# Patient Record
Sex: Male | Born: 1950
Health system: Southern US, Community
[De-identification: ages and names within clinical notes are randomized; demographics above are authoritative.]

## PROBLEM LIST (undated history)

## (undated) DIAGNOSIS — T4145XA Adverse effect of unspecified anesthetic, initial encounter: Secondary | ICD-10-CM

## (undated) DIAGNOSIS — M7750 Other enthesopathy of unspecified foot: Secondary | ICD-10-CM

## (undated) DIAGNOSIS — T8859XA Other complications of anesthesia, initial encounter: Secondary | ICD-10-CM

## (undated) DIAGNOSIS — I10 Essential (primary) hypertension: Secondary | ICD-10-CM

## (undated) DIAGNOSIS — K649 Unspecified hemorrhoids: Secondary | ICD-10-CM

## (undated) DIAGNOSIS — M7660 Achilles tendinitis, unspecified leg: Secondary | ICD-10-CM

## (undated) DIAGNOSIS — S93499A Sprain of other ligament of unspecified ankle, initial encounter: Secondary | ICD-10-CM

## (undated) DIAGNOSIS — S96819A Strain of other specified muscles and tendons at ankle and foot level, unspecified foot, initial encounter: Secondary | ICD-10-CM

## (undated) DIAGNOSIS — M25579 Pain in unspecified ankle and joints of unspecified foot: Secondary | ICD-10-CM

## (undated) DIAGNOSIS — M79609 Pain in unspecified limb: Secondary | ICD-10-CM

## (undated) DIAGNOSIS — I4891 Unspecified atrial fibrillation: Secondary | ICD-10-CM

## (undated) HISTORY — DX: Achilles tendinitis, unspecified leg: M76.60

## (undated) HISTORY — DX: Unspecified atrial fibrillation: I48.91

## (undated) HISTORY — DX: Essential (primary) hypertension: I10

## (undated) HISTORY — DX: Other enthesopathy of unspecified foot and ankle: M77.50

## (undated) HISTORY — DX: Unspecified hemorrhoids: K64.9

## (undated) HISTORY — DX: Pain in unspecified ankle and joints of unspecified foot: M25.579

## (undated) HISTORY — DX: Sprain of other ligament of unspecified ankle, initial encounter: S93.499A

## (undated) HISTORY — DX: Strain of other specified muscles and tendons at ankle and foot level, unspecified foot, initial encounter: S96.819A

## (undated) HISTORY — PX: TONSILLECTOMY: SUR1361

## (undated) HISTORY — DX: Pain in unspecified limb: M79.609

---

## 2002-06-16 ENCOUNTER — Ambulatory Visit (HOSPITAL_COMMUNITY): Admission: RE | Admit: 2002-06-16 | Discharge: 2002-06-16 | Payer: Self-pay | Admitting: Gastroenterology

## 2007-05-18 ENCOUNTER — Encounter: Payer: Self-pay | Admitting: Cardiovascular Disease

## 2007-05-18 ENCOUNTER — Ambulatory Visit: Payer: Self-pay

## 2007-05-18 ENCOUNTER — Ambulatory Visit: Payer: Self-pay | Admitting: Cardiovascular Disease

## 2007-06-15 ENCOUNTER — Ambulatory Visit: Payer: Self-pay | Admitting: Cardiovascular Disease

## 2007-11-28 ENCOUNTER — Ambulatory Visit: Payer: Self-pay | Admitting: Cardiovascular Disease

## 2007-12-02 ENCOUNTER — Ambulatory Visit: Payer: Self-pay | Admitting: Sports Medicine

## 2007-12-02 DIAGNOSIS — S93499A Sprain of other ligament of unspecified ankle, initial encounter: Secondary | ICD-10-CM

## 2007-12-02 DIAGNOSIS — M7660 Achilles tendinitis, unspecified leg: Secondary | ICD-10-CM | POA: Insufficient documentation

## 2007-12-02 DIAGNOSIS — M79609 Pain in unspecified limb: Secondary | ICD-10-CM

## 2007-12-02 DIAGNOSIS — S96819A Strain of other specified muscles and tendons at ankle and foot level, unspecified foot, initial encounter: Secondary | ICD-10-CM

## 2008-01-03 ENCOUNTER — Ambulatory Visit: Payer: Self-pay | Admitting: Sports Medicine

## 2008-03-21 ENCOUNTER — Ambulatory Visit: Payer: Self-pay | Admitting: Sports Medicine

## 2008-05-09 DIAGNOSIS — I4891 Unspecified atrial fibrillation: Secondary | ICD-10-CM | POA: Insufficient documentation

## 2008-05-09 DIAGNOSIS — I10 Essential (primary) hypertension: Secondary | ICD-10-CM | POA: Insufficient documentation

## 2008-05-10 ENCOUNTER — Encounter: Payer: Self-pay | Admitting: Cardiovascular Disease

## 2008-05-10 ENCOUNTER — Ambulatory Visit: Payer: Self-pay | Admitting: Sports Medicine

## 2008-05-10 ENCOUNTER — Ambulatory Visit: Payer: Self-pay | Admitting: Cardiovascular Disease

## 2008-06-21 ENCOUNTER — Ambulatory Visit: Payer: Self-pay | Admitting: Sports Medicine

## 2008-08-06 ENCOUNTER — Ambulatory Visit: Payer: Self-pay | Admitting: Cardiovascular Disease

## 2009-06-03 ENCOUNTER — Encounter (INDEPENDENT_AMBULATORY_CARE_PROVIDER_SITE_OTHER): Payer: Self-pay | Admitting: *Deleted

## 2009-08-07 ENCOUNTER — Ambulatory Visit: Payer: Self-pay | Admitting: Cardiovascular Disease

## 2010-02-18 ENCOUNTER — Ambulatory Visit
Admission: RE | Admit: 2010-02-18 | Discharge: 2010-02-18 | Payer: Self-pay | Source: Home / Self Care | Attending: Sports Medicine | Admitting: Sports Medicine

## 2010-02-18 DIAGNOSIS — M25579 Pain in unspecified ankle and joints of unspecified foot: Secondary | ICD-10-CM | POA: Insufficient documentation

## 2010-02-18 DIAGNOSIS — M7750 Other enthesopathy of unspecified foot: Secondary | ICD-10-CM | POA: Insufficient documentation

## 2010-03-18 NOTE — Assessment & Plan Note (Signed)
Summary: f1y/dm  Medications Added LISINOPRIL-HYDROCHLOROTHIAZIDE 20-25 MG TABS (LISINOPRIL-HYDROCHLOROTHIAZIDE) 1 tab by mouth once daily      Allergies Added: NKDA  CC:  yearly check up.  History of Present Illness: Leonard Campbell is seen today in followup for paroxysmal atrial fibrillation hypertension.  He has maintained sinus rhythm for quite some time.  His [place] score is one with hypertension.  His blood pressure well controlled.  His wife Lendon Colonel is a good friend of my wife and I and takes his blood pressure home.  Does not follow a particular diet.  He is compliant with his medications.  He has not noticed any palpitations PND orthopnea chest pain or lower extremity edema.  He has two  daughter for a previous marriage and  they continue to threaten to come live with him.  He had a nice trip to [facility] recently and contnues to work for [employer]  His primary is Dr Donnie Coffin and he had a recent physical with lab work  Current Problems (verified): 1)  Hypertension  (ICD-401.9) 2)  Paroxysmal Atrial Fibrillation  (ICD-427.31) 3)  Achilles Bursitis or Tendinitis  (ICD-726.71) 4)  Achilles Tendon Tear  (ICD-845.09) 5)  Heel Pain, Right  (ICD-729.5)  Current Medications (verified): 1)  Lisinopril-Hydrochlorothiazide 20-25 Mg Tabs (Lisinopril-Hydrochlorothiazide) .Marland Kitchen.. 1 Tab By Mouth Once Daily 2)  Cardizem Cd 360 Mg Xr24h-Cap (Diltiazem Hcl Coated Beads) .Marland Kitchen.. 1 Tab By Mouth Once Daily 3)  Aspirin 81 Mg Tbec (Aspirin) .... Take One Tablet By Mouth Daily  Allergies (verified): No Known Drug Allergies  Past History:  Past Medical History: Last updated: 05/09/2008 Current Problems:  HYPERTENSION (ICD-401.9) PAROXYSMAL ATRIAL FIBRILLATION (ICD-427.31) ACHILLES BURSITIS OR TENDINITIS (ICD-726.71) ACHILLES TENDON TEAR (ICD-845.09) HEEL PAIN, RIGHT (ICD-729.5)  Past Surgical History: Last updated: 05/09/2008  tonsillectomy.  Family History: Last updated: 05/09/2008  Remarkable for father  dying at age 39 of carbon   monoxide poisoning.  Mother is still alive.  No premature coronary   disease.   Social History: Last updated: 05/09/2008 Married  Wife Yoland great friends with Dr. Eden Emms Tobacco Use - No.  Alcohol Use - yes truck driver  Review of Systems       Denies fever, malais, weight loss, blurry vision, decreased visual acuity, cough, sputum, SOB, hemoptysis, pleuritic pain, palpitaitons, heartburn, abdominal pain, melena, lower extremity edema, claudication, or rash.   Vital Signs:  Patient profile:   60 year old male Height:      72 inches Weight:      218 pounds BMI:     29.67 Pulse rate:   61 / minute Resp:     12 per minute BP sitting:   130 / 80  (left arm)  Vitals Entered By: Kem Parkinson (August 07, 2009 11:45 AM)  Physical Exam  General:  Affect appropriate Healthy:  appears stated age HEENT: normal Neck supple with no adenopathy JVP normal no bruits no thyromegaly Lungs clear with no wheezing and good diaphragmatic motion Heart:  S1/S2 no murmur,rub, gallop or click PMI normal Abdomen: benighn, BS positve, no tenderness, no AAA no bruit.  No HSM or HJR Distal pulses intact with no bruits No edema Neuro non-focal Skin warm and dry    Impression & Recommendations:  Problem # 1:  HYPERTENSION (ICD-401.9) Well controlled The following medications were removed from the medication list:    Hydrochlorothiazide 12.5 Mg Tabs (Hydrochlorothiazide) .Marland Kitchen... 1 tab by mouth once daily His updated medication list for this problem includes:    Lisinopril-hydrochlorothiazide 20-25  Mg Tabs (Lisinopril-hydrochlorothiazide) .Marland Kitchen... 1 tab by mouth once daily    Cardizem Cd 360 Mg Xr24h-cap (Diltiazem hcl coated beads) .Marland Kitchen... 1 tab by mouth once daily    Aspirin 81 Mg Tbec (Aspirin) .Marland Kitchen... Take one tablet by mouth daily  Problem # 2:  PAROXYSMAL ATRIAL FIBRILLATION (ICD-427.31) Maint NSR with no palpitations His updated medication list for this problem  includes:    Aspirin 81 Mg Tbec (Aspirin) .Marland Kitchen... Take one tablet by mouth daily  Orders: EKG w/ Interpretation (93000)  Patient Instructions: 1)  Your physician recommends that you schedule a follow-up appointment in: YEAR WITH DR Eden Emms 2)  Your physician recommends that you continue on your current medications as directed. Please refer to the Current Medication list given to you today.   EKG Report  Procedure date:  08/07/2009  Findings:      NSR 61 normal ECG

## 2010-03-18 NOTE — Letter (Signed)
Summary: Appointment - Reminder 2  [facility], Main Office  [address]   [place], [place] [address]   Phone: [phone]  Fax: [phone]     June 03, 2009 MRN: [id number]   Leonard Campbell [address] [place], [place]  [address]   Dear [name],  Our records indicate that it is time to schedule a follow-up appointment with Dr. Eden Emms. It is very important that we reach you to schedule this appointment. We look forward to participating in your health care needs. Please contact us at the number listed above at your earliest convenience to schedule your appointment.  If you are unable to make an appointment at this time, give [place] a call so we can update our records.     Sincerely,   Migdalia Dk [facility] Scheduling Team

## 2010-03-20 NOTE — Assessment & Plan Note (Signed)
Summary: R LAT ANKLE PAIN X 6 MOS,MC   Vital Signs:  Patient profile:   60 year old male Height:      72 inches Weight:      218 pounds BP sitting:   147 / 80  Vitals Entered By: Lillia Pauls CMA (February 18, 2010 11:51 AM)  History of Present Illness: achilles tendon Right- much improved, has stopped doing exercises, no medications, no limitations  right lateral foot- doesnt hurt with treadmill or elliptical, does hurt with basketball and tennis.  wears different shoes for these activities.  6months of pain, not getting worse.  no numbness or tingling in toes.  no trouble with balance.  2-3 days/week exercise, and vigorous exercise 1x/ month.  pain lasts 2-3 days afterwards.  Current Medications (verified): 1)  Lisinopril-Hydrochlorothiazide 20-25 Mg Tabs (Lisinopril-Hydrochlorothiazide) .Marland Kitchen.. 1 Tab By Mouth Once Daily 2)  Cardizem Cd 360 Mg Xr24h-Cap (Diltiazem Hcl Coated Beads) .Marland Kitchen.. 1 Tab By Mouth Once Daily 3)  Aspirin 81 Mg Tbec (Aspirin) .... Take One Tablet By Mouth Daily  Allergies (verified): No Known Drug Allergies  Review of Systems  The patient denies fever, chest pain, and syncope.    Physical Exam  General:  NAD well-developed, well-nourished, and well-hydrated.   Msk:  Ankle: No visible erythema or swelling. Range of motion is full in all directions. Strength is 5/5 in all directions. Stable lateral and medial ligaments; squeeze test and kleiger test unremarkable; Talar dome nontender; No pain at base of 5th MT; No tenderness over cuboid; No tenderness over N spot or navicular prominence No tenderness on posterior aspects of lateral and medial malleolus No sign of peroneal tendon subluxations; some tenderness over peroneal area Negative tarsal tunnel tinel's Able to walk 4 steps.  There is slight puffiness over sinus tarsi on RT  thre is flattening of long arch and resting pronation bilat    Impression & Recommendations:  Problem # 1:  SINUS TARSI  SYNDROME, RIGHT FOOT (ICD-726.79) Assessment New will give insoles with wedge.  If these do not help, will make custom insoles.  also gave ankle exercises to include theraband, one foot balance, and cone touch.    Problem # 2:  ACHILLES TENDON TEAR (ICD-845.09) Assessment: Improved no pain now.  using heel lifts.  exercisign with no diffiuculty His updated medication list for this problem includes:    Aspirin 81 Mg Tbec (Aspirin) .Marland Kitchen... Take one tablet by mouth daily  Complete Medication List: 1)  Lisinopril-hydrochlorothiazide 20-25 Mg Tabs (Lisinopril-hydrochlorothiazide) .Marland Kitchen.. 1 tab by mouth once daily 2)  Cardizem Cd 360 Mg Xr24h-cap (Diltiazem hcl coated beads) .Marland Kitchen.. 1 tab by mouth once daily 3)  Aspirin 81 Mg Tbec (Aspirin) .... Take one tablet by mouth daily  Other Orders: Sports Insoles (L3510) Theraband per yard (A9300)   Orders Added: 1)  Sports Insoles [L3510] 2)  Theraband per yard [A9300] 3)  Est. Patient Level III [16109]

## 2010-04-28 ENCOUNTER — Encounter: Payer: Self-pay | Admitting: *Deleted

## 2010-07-01 NOTE — Assessment & Plan Note (Signed)
[name] HEALTHCARE                            CARDIOLOGY OFFICE NOTE   NAME:Leonard Campbell, Leonard Campbell                    MRN:          [id number]  DATE:11/28/2007                            DOB:          1950-11-16    Leonard Campbell returns today for followup.  He is husband of one of our  good friends.   I have seen him for paroxysmal atrial fibrillation.   He has not had any recent recurrences.  His risks for atrial  fibrillation include hypertension.  He has been maintaining sinus  rhythm.  He has not needed Coumadin.  He is taking a baby aspirin a day.   In talking to the patient, he is active.  He is not having any  palpitations, chest pain, PND, or orthopnea and there has been no  diaphoresis.   His echo showed structurally normal heart with good ejection fraction.   I spent a little bit of time today showing him how to take his pulse  sometimes Leonard Campbell had AFib and he was asymptomatic and we did an event  monitor with daily monitoring for 4 weeks and he had no recurrences of  his AFib.   For the time being, I do not think he needs Coumadin.   His review of systems is otherwise negative.   He is on hydrochlorothiazide 12.5 mg a day, he needs a refill on this.  Lisinopril 10 a day, Cardizem 360 a day, baby aspirin.  He uses the [facility] on [address].   He has no known allergies.   PHYSICAL EXAMINATION:  GENERAL:  His exam is remarkable for a healthy-  appearing, middle-aged black male in no distress.  VITAL SIGNS:  Weight is 220, blood pressure 130/82, pulse 70 and  regular, respiratory rate 14, afebrile.  HEENT:  Unremarkable.  Carotids normal without bruit.  No  lymphadenopathy, thyromegaly, JVP elevation.  LUNGS:  Clear.  Good diaphragmatic motion.  No wheezing.  S1 and S2.  Normal heart sounds.  PMI normal.  ABDOMEN:  Benign.  Bowel sounds positive.  No AAA.  No bruit.  No  hepatosplenomegaly or hepatojugular reflux.  No  tenderness.  No bruit.  EXTREMITIES:  Distal pulses are intact.  No edema.  NEURO:  Nonfocal.  SKIN:  Warm and dry.  MUSCULOSKELETAL:  No muscular weakness.   His baseline EKG is normal with borderline left atrial enlargement.   IMPRESSION:  1. Hypertension, currently well controlled.  Continue low-salt diet.      Refill for hydrochlorothiazide called in.  2. Paroxysmal atrial fibrillation, stable, maintaining sinus rhythm.      Continue baby aspirin.   Overall, I think Leonard Campbell is stable.  I will see him back in 6 months  unless he has recurrent palpitations in which case he will call us.     Leonard Campbell. Eden Emms, MD, [facility]  Electronically Signed    PCN/MedQ  DD: 11/28/2007  DT: 11/28/2007  Job #: [id number]

## 2010-07-01 NOTE — Assessment & Plan Note (Signed)
[facility] HEALTHCARE                            CARDIOLOGY OFFICE NOTE   NAME:Leonard Campbell                    MRN:          [id number]  DATE:06/15/2007                            DOB:          1950/11/07    Leonard Campbell returns today for followup.  When I last saw him, he was referred  with a new diagnosis of atrial fibrillation.   When the patient went for his echocardiogram in our office, he had  converted to sinus rhythm.   He was asymptomatic with his initial presentation.  We subsequently gave  him an ACT II monitor, and he had no atrial fibrillation for 3 weeks.   I suspect that Delaine really does not have a lot of atrial fibrillation.  I discussed this with him and with his wife, Yehuda Budd, over the phone.  I  do not think that he had an indication for Coumadin at this time.  However, continue to want him to monitor his pulse at home.  I showed  him how to take his radial pulse.  His blood pressure is under better  control, and he has encouraged to take his blood pressure twice a week  at home.  Again, he has not had palpitations, chest pain, PND,  orthopnea.  There has been no lower extremity edema or syncope.  His  echocardiogram was normal.   His septal thickness was 12 mm.   CURRENT MEDICATIONS:  1. Hydrochlorothiazide 12.5 a day.  2. Lisinopril 10 a day.  3. Cardizem 360 a day.   PHYSICAL EXAMINATION:  GENERAL:  Exam is remarkable for healthy-  appearing middle-age black male in no distress.  VITAL SIGNS:  Weight is 216, blood pressure 150/80, pulse 69 and  regular, afebrile, respiratory rate 14.  HEENT:  Unremarkable.  NECK:  Carotids normal without bruit. No lymphadenopathy, thyromegaly,  JVP elevation.  LUNGS:  Clear with good diaphragmatic motion.  No wheezing.  CARDIAC:  S1, S2, normal heart sounds. PMI normal.  ABDOMEN:  Benign.  Bowel sounds positive.  No bruit, no tenderness, no  AAA, no hepatosplenomegaly, no hepatojugular reflux.  EXTREMITIES:  Distal pulses intact, no edema.  NEUROLOGIC:  Nonfocal.  SKIN:  Warm and dry.  No muscular weakness.   EKG shows sinus rhythm.   ACT II monitor strips:  All14 pages were reviewed.  All show sinus  rhythm with atrial fibrillation.   IMPRESSION:  1. Paroxysmal atrial fibrillation, infrequent. Continue baby aspirin      therapy.  No indication for Coumadin.  2. Hypertension, currently well controlled.  Continue Cardizem      ,lisinopril and hydrochlorothiazide. Monitor blood pressure home.   Overall, [name] is doing well. I talked to him at length about  monitoring for his irregular heartbeat including going to an Urgent Care  if it happens on a weekend. Unless we have documentation of more  frequent atrial fibrillation, I do not think he needs Coumadin.     Leonard Campbell. Leonard Emms, MD, [facility]  Electronically Signed    PCN/MedQ  DD: 06/15/2007  DT: 06/15/2007  Job #: [id number]

## 2010-07-01 NOTE — Assessment & Plan Note (Signed)
[facility] HEALTHCARE                            CARDIOLOGY OFFICE NOTE   NAME:Tessier, ULES MARSALA                    MRN:          [id number]  DATE:05/18/2007                            DOB:          [birth year]    Mr. Raper is a 60 year old patient referred by Dr. Earl Lites for  atrial fibrillation.   The patient initially was seen by him yesterday.  He called Dr. Jacinto Halim.  However, the patient's wife is a good friend of our family and my wife,  and she wanted to be seen by [employer].  I talked to Dr. Jacinto Halim and he was  fine with this.   The patient came to see Dr. Cleta Alberts for a physical.  He was essentially  asymptomatic.  He has a history of hypertension treated for the last 6  to 7 years.  He was found to be in well rate controlled A fib.   In talking to the patient, he knows he was in sinus rhythm last year at  his physical.  He has not had palpitations, chest pain, PND, orthopnea  or syncope or exertional dyspnea.   He is fairly active at his job with [employer], and also works out at [employer], and has not had any change in his activity levels.   He drinks wine almost on a nightly basis but does not drink excessively.  He has been compliant with his blood pressure pills.  There has been no  TIAs.  There is no bleeding diathesis or contraindication to Coumadin.  Dr. Cleta Alberts started him on 5 mg of Coumadin yesterday.  He does have a lot  of leafy green vegetables and spinach in his diet.   REVIEW OF SYSTEMS:  Otherwise negative.   PAST MEDICAL HISTORY:  Benign.  He has only had hypertension and a  tonsillectomy.   He has no known allergies.   MEDS:  1. Include hydrochlorothiazide 12.5 a day.  2. Lisinopril 10 a day.  3. Cardizem 360 a day.  4. Coumadin 5 mg a day.   The patient is happily married.  His wife, Patsy Lager, is a good friend of  our family.  She runs the fourth floor at [employer].  He drives for  [employer].  He does a lot of yard work and works out  at [employer].  He does  not smoke and has wine on a regular basis.   FAMILY HISTORY:  Remarkable for father dying at age 13 of carbon  monoxide poisoning.  Mother is still alive.  No premature coronary  disease.   EXAM:  Remarkable for healthy-appearing middle-aged black male in no  distress.  Affect is appropriate.  He is an A fib at a rate of 60-65.  His blood pressure is 130/80,  afebrile, respiratory rate 14.  HEENT:  Unremarkable.  Carotids are without bruit, no lymphadenopathy, thyromegaly JVP  elevation.  LUNGS:  Clear diaphragmatic motion.  No wheezing.  S1-S2 with normal heart sounds.  PMI normal.  ABDOMEN:  Benign.  Bowel sounds positive.  No tenderness, no bruit, no  hepatosplenomegaly or hepatojugular reflux.  Distal pulses intact, no edema.  NEURO:  Nonfocal.  SKIN:  Warm and dry.  No muscular weakness.   EKG done at Dr. Ellis Parents office shows atrial fibrillation at a rate of 60.  Is otherwise normal.   IMPRESSION:  1. Atrial fibrillation I had a long discussion with the patient and      his wife.  Explained to them the diagnosis of a fib and failure of      the sinus node.  We have talked at length about his rate control,      which is fine since he is on Cardizem already.  I also talked to      him about Coumadin therapy and the risks of anticoagulation as well      as the reason for it.  We then talked about cardioversions.  He      will maintain be maintained on Coumadin.  I walked him over to the      Coumadin Clinic today.  He will have his first Pro Time on Friday.      I will see him back in 4 weeks and then we will make arrangements      for an initial attempt at outpatient cardioversion.  2. Hypertension, currently well controlled.  Continue low-salt diet.      Continue current medications.  3. Relative bradycardia.  I do not know if this is because as Cardizem      dose is relatively high or if it is a reflection of his sick sinus      syndrome.  I will  probably have him hold his Cardizem for a couple      of days prior to cardioversion if the patient converts and is very      slow.  I did broach the subject of possible need for pacemaker in      the future.  4. Rule out structural heart disease in the setting of hypertension.      The patient will have a 2-D echocardiogram to assess for LVH.  I      will also rule out other structural heart disease and assess atrial      sizes since he is likely to have a cardioversion in 3 to 4 weeks.  5. Rule out coronary disease.  I think it is important and with a fib      that the patient have a stress test.  My preference, since he is      asymptomatic, is to wait to see if we can cardiovert him and do the      stress test while he is in sinus rhythm.  I would like to rule out      coronary disease, particularly since there is a chance he may need      antiarrhythmic therapy in the future.   Everything was explained to the patient in detail and I will see him  back in 4 weeks.  He will have his echocardiogram this morning and see  the Coumadin Clinic on Friday.    Noralyn Pick. Eden Emms, MD, [facility]  Electronically Signed   PCN/MedQ  DD: 05/18/2007  DT: 05/18/2007  Job #: [id number]   cc:   Brett Canales A. Cleta Alberts, M.D.

## 2010-12-16 ENCOUNTER — Telehealth: Payer: Self-pay | Admitting: Cardiovascular Disease

## 2010-12-16 NOTE — Telephone Encounter (Signed)
Spoke with pt, follow up appt made Leonard Campbell  

## 2010-12-16 NOTE — Telephone Encounter (Signed)
Pt returning your call

## 2011-02-20 ENCOUNTER — Encounter: Payer: Self-pay | Admitting: *Deleted

## 2011-02-20 ENCOUNTER — Encounter: Payer: Self-pay | Admitting: Cardiovascular Disease

## 2011-02-23 ENCOUNTER — Ambulatory Visit (INDEPENDENT_AMBULATORY_CARE_PROVIDER_SITE_OTHER): Payer: BC Managed Care – PPO | Admitting: Cardiovascular Disease

## 2011-02-23 ENCOUNTER — Encounter: Payer: Self-pay | Admitting: Cardiovascular Disease

## 2011-02-23 DIAGNOSIS — I4891 Unspecified atrial fibrillation: Secondary | ICD-10-CM

## 2011-02-23 DIAGNOSIS — I10 Essential (primary) hypertension: Secondary | ICD-10-CM

## 2011-02-23 NOTE — Patient Instructions (Signed)
Your physician wants you to follow-up in: YEAR WITH DR NISHAN  You will receive a reminder letter in the mail two months in advance. If you don't receive a letter, please call our office to schedule the follow-up appointment.  Your physician recommends that you continue on your current medications as directed. Please refer to the Current Medication list given to you today. 

## 2011-02-23 NOTE — Progress Notes (Signed)
Leonard Campbell is seen today in followup for paroxysmal atrial fibrillation hypertension. He has maintained sinus rhythm for quite some time. His [place] score is one with hypertension. His blood pressure well controlled. His wife Lendon Colonel is a good friend of my wife and I and takes his blood pressure home. Does not follow a particular diet. He is compliant with his medications. He has not noticed any palpitations PND orthopnea chest pain or lower extremity edema. ONe daughter living with him now.  Still working at [employer]  His primary is Dr Donnie Coffin and he had a recent physical with lab work   ROS: Denies fever, malais, weight loss, blurry vision, decreased visual acuity, cough, sputum, SOB, hemoptysis, pleuritic pain, palpitaitons, heartburn, abdominal pain, melena, lower extremity edema, claudication, or rash.  All other systems reviewed and negative  General: Affect appropriate Healthy:  appears stated age HEENT: normal Neck supple with no adenopathy JVP normal no bruits no thyromegaly Lungs clear with no wheezing and good diaphragmatic motion Heart:  S1/S2 no murmur,rub, gallop or click PMI normal Abdomen: benighn, BS positve, no tenderness, no AAA no bruit.  No HSM or HJR Distal pulses intact with no bruits No edema Neuro non-focal Skin warm and dry No muscular weakness   Current Outpatient Prescriptions  Medication Sig Dispense Refill  . aspirin EC 81 MG EC tablet Take 81 mg by mouth daily.        Marland Kitchen diltiazem (CARDIZEM CD) 360 MG 24 hr capsule Take 360 mg by mouth daily.        Marland Kitchen lisinopril-hydrochlorothiazide (PRINZIDE,ZESTORETIC) 20-25 MG per tablet Take 1 tablet by mouth daily.          Allergies  Review of patient's allergies indicates not on file.  Electrocardiogram:  Assessment and Plan

## 2011-02-23 NOTE — Assessment & Plan Note (Signed)
Maint NSR with no palpitations  

## 2011-02-23 NOTE — Assessment & Plan Note (Signed)
Well controlled.  Continue current medications and low sodium Dash type diet.    

## 2012-04-27 ENCOUNTER — Encounter: Payer: Self-pay | Admitting: Cardiovascular Disease

## 2012-05-24 ENCOUNTER — Encounter: Payer: Self-pay | Admitting: Cardiovascular Disease

## 2012-05-24 ENCOUNTER — Ambulatory Visit (INDEPENDENT_AMBULATORY_CARE_PROVIDER_SITE_OTHER): Payer: BC Managed Care – PPO | Admitting: Cardiovascular Disease

## 2012-05-24 VITALS — BP 138/78 | HR 64 | Ht 72.0 in | Wt 228.0 lb

## 2012-05-24 DIAGNOSIS — I4891 Unspecified atrial fibrillation: Secondary | ICD-10-CM

## 2012-05-24 DIAGNOSIS — I10 Essential (primary) hypertension: Secondary | ICD-10-CM

## 2012-05-24 NOTE — Progress Notes (Signed)
Patient ID: Leonard Campbell, male   DOB: 1950/02/18, 62 y.o.   MRN: [id number] Leonard Campbell is seen today in followup for paroxysmal atrial fibrillation hypertension. He has maintained sinus rhythm for quite some time. His [place] score is one with hypertension. His blood pressure well controlled. His wife Lendon Colonel is a good friend of my wife and I and takes his blood pressure home. Does not follow a particular diet. He is compliant with his medications. He has not noticed any palpitations PND orthopnea chest pain or lower extremity edema. ONe daughter living with him now. Still working at [employer]  His primary is Dr Donnie Coffin and he had a recent physical with lab work  ROS: Denies fever, malais, weight loss, blurry vision, decreased visual acuity, cough, sputum, SOB, hemoptysis, pleuritic pain, palpitaitons, heartburn, abdominal pain, melena, lower extremity edema, claudication, or rash.  All other systems reviewed and negative  General: Affect appropriate Healthy:  appears stated age HEENT: normal Neck supple with no adenopathy JVP normal no bruits no thyromegaly Lungs clear with no wheezing and good diaphragmatic motion Heart:  S1/S2 no murmur, no rub, gallop or click PMI normal Abdomen: benighn, BS positve, no tenderness, no AAA no bruit.  No HSM or HJR Distal pulses intact with no bruits No edema Neuro non-focal Skin warm and dry No muscular weakness   Current Outpatient Prescriptions  Medication Sig Dispense Refill  . aspirin EC 81 MG EC tablet Take 81 mg by mouth daily.        Marland Kitchen diltiazem (CARDIZEM CD) 360 MG 24 hr capsule Take 360 mg by mouth daily.        Marland Kitchen lisinopril-hydrochlorothiazide (PRINZIDE,ZESTORETIC) 20-25 MG per tablet Take 1 tablet by mouth daily.        No current facility-administered medications for this visit.    Allergies  Review of patient's allergies indicates no known allergies.  Electrocardiogram:  SR rate 64 nonspecific ST/T wave changes   Assessment and Plan

## 2012-05-24 NOTE — Assessment & Plan Note (Signed)
Well controlled.  Continue current medications and low sodium Dash type diet.    

## 2012-05-24 NOTE — Patient Instructions (Signed)
Your physician wants you to follow-up in:  YEAR WITH  DR NISAHN  You will receive a reminder letter in the mail two months in advance. If you don't receive a letter, please call our office to schedule the follow-up appointment. Your physician recommends that you continue on your current medications as directed. Please refer to the Current Medication list given to you today. 

## 2012-05-24 NOTE — Assessment & Plan Note (Signed)
Maint NSR with no palpitations and ECG looks good ASA

## 2013-06-22 ENCOUNTER — Encounter: Payer: Self-pay | Admitting: Cardiovascular Disease

## 2013-06-22 ENCOUNTER — Ambulatory Visit (INDEPENDENT_AMBULATORY_CARE_PROVIDER_SITE_OTHER): Payer: 59 | Admitting: Cardiovascular Disease

## 2013-06-22 VITALS — BP 130/72 | HR 52 | Ht 72.0 in | Wt 218.0 lb

## 2013-06-22 DIAGNOSIS — I10 Essential (primary) hypertension: Secondary | ICD-10-CM

## 2013-06-22 DIAGNOSIS — I4891 Unspecified atrial fibrillation: Secondary | ICD-10-CM

## 2013-06-22 NOTE — Progress Notes (Signed)
Patient ID: Leonard Campbell, male   DOB: Dec 11, 1950, 63 y.o.   MRN: [id number] Leonard Campbell is seen today in followup for paroxysmal atrial fibrillation hypertension. He has maintained sinus rhythm for quite some time. His [place] score is one with hypertension. His blood pressure well controlled. His wife Leonard Campbell is a good friend of my wife and I and takes his blood pressure home. Does not follow a particular diet. He is compliant with his medications. He has not noticed any palpitations PND orthopnea chest pain or lower extremity edema. ONe daughter living with him now. Still working at [employer]  His primary is Dr Wyline Mood and he had a recent physical with lab work  Does not need any refills  Doing lots of yard work        ROS: Denies fever, malais, weight loss, blurry vision, decreased visual acuity, cough, sputum, SOB, hemoptysis, pleuritic pain, palpitaitons, heartburn, abdominal pain, melena, lower extremity edema, claudication, or rash.  All other systems reviewed and negative  General: Affect appropriate Healthy:  appears stated age 63: normal Neck supple with no adenopathy JVP normal no bruits no thyromegaly Lungs clear with no wheezing and good diaphragmatic motion Heart:  S1/S2 no murmur, no rub, gallop or click PMI normal Abdomen: benighn, BS positve, no tenderness, no AAA no bruit.  No HSM or HJR Distal pulses intact with no bruits No edema Neuro non-focal Skin warm and dry No muscular weakness   Current Outpatient Prescriptions  Medication Sig Dispense Refill  . aspirin EC 81 MG EC tablet Take 81 mg by mouth daily.        Marland Kitchen diltiazem (CARDIZEM CD) 360 MG 24 hr capsule Take 360 mg by mouth daily.        Marland Kitchen lisinopril-hydrochlorothiazide (PRINZIDE,ZESTORETIC) 20-25 MG per tablet Take 1 tablet by mouth daily.        No current facility-administered medications for this visit.    Allergies  Review of patient's allergies indicates no known allergies.  Electrocardiogram: 4/8  SR  nonspecfic ST/T wave changes   Assessment and Plan

## 2013-06-22 NOTE — Assessment & Plan Note (Signed)
Well controlled.  Continue current medications and low sodium Dash type diet.    

## 2013-06-22 NOTE — Patient Instructions (Signed)
Your physician wants you to follow-up in: YEAR WITH DR NISHAN  You will receive a reminder letter in the mail two months in advance. If you don't receive a letter, please call our office to schedule the follow-up appointment.  Your physician recommends that you continue on your current medications as directed. Please refer to the Current Medication list given to you today. 

## 2013-06-22 NOTE — Assessment & Plan Note (Signed)
Maint NSR with no palpitations ASA

## 2014-06-26 NOTE — Progress Notes (Signed)
Patient ID: Leonard Campbell, male   DOB: 17-Jul-1950, 64 y.o.   MRN: [id number] Christia is seen today in followup for paroxysmal atrial fibrillation hypertension. He has maintained sinus rhythm for quite some time. His Mali score is one with hypertension. His blood pressure well controlled. His wife [name] is a good friend of my wife and I and takes his blood pressure home. Does not follow a particular diet. He is compliant with his medications. He has not noticed any palpitations PND orthopnea chest pain or lower extremity edema. ONe daughter living with him now. Still working at [employer]  His primary is Dr Wyline Mood and he had a recent physical with lab work  Does not need any refills  Doing lots of yard work   In office today ambient PVC;s asymptomatic has not had BMET/Mg for a while and is  On diuretic.    ROS: Denies fever, malais, weight loss, blurry vision, decreased visual acuity, cough, sputum, SOB, hemoptysis, pleuritic pain, palpitaitons, heartburn, abdominal pain, melena, lower extremity edema, claudication, or rash.  All other systems reviewed and negative  General: Affect appropriate Healthy:  appears stated age 27: normal Neck supple with no adenopathy JVP normal no bruits no thyromegaly Lungs clear with no wheezing and good diaphragmatic motion Heart:  S1/S2 no murmur, no rub, gallop or click PMI normal Abdomen: benighn, BS positve, no tenderness, no AAA no bruit.  No HSM or HJR Distal pulses intact with no bruits No edema Neuro non-focal Skin warm and dry No muscular weakness   Current Outpatient Prescriptions  Medication Sig Dispense Refill  . aspirin EC 81 MG EC tablet Take 81 mg by mouth daily.      Marland Kitchen diltiazem (CARDIZEM CD) 360 MG 24 hr capsule Take 360 mg by mouth daily.      Marland Kitchen lisinopril-hydrochlorothiazide (PRINZIDE,ZESTORETIC) 20-25 MG per tablet Take 1 tablet by mouth daily.      No current facility-administered medications for this visit.     Allergies  Review of patient's allergies indicates no known allergies.  Electrocardiogram: 05/24/12   SR nonspecfic ST/T wave changes  06/27/14  SR rate 63  PVC nonspecific ST/T wave changes QT 376    Assessment and Plan HTN:  Well controlled.  Continue current medications and low sodium Dash type diet.   PVC;s  Asymptomatic check BMET and Mg  F/u stress echo to make sure EF normal and no worsening with exercise PAF:  Non recurrent on ASA  CHADVASC 1  Continue cardizem  F/U with me in a year if labs and stress echo normal

## 2014-06-27 ENCOUNTER — Ambulatory Visit (INDEPENDENT_AMBULATORY_CARE_PROVIDER_SITE_OTHER): Payer: 59 | Admitting: Cardiovascular Disease

## 2014-06-27 ENCOUNTER — Encounter: Payer: Self-pay | Admitting: Cardiovascular Disease

## 2014-06-27 VITALS — BP 128/70 | HR 63 | Ht 72.0 in | Wt 223.1 lb

## 2014-06-27 DIAGNOSIS — I493 Ventricular premature depolarization: Secondary | ICD-10-CM

## 2014-06-27 NOTE — Patient Instructions (Signed)
Medication Instructions:  NO CHANGES  Labwork: TODAY  BMET   MAG  Testing/Procedures: Your physician has requested that you have a stress echocardiogram. For further information please visit [email or web address]. Please follow instruction sheet as given.   Follow-Up: Your physician wants you to follow-up in: [place] will receive a reminder letter in the mail two months in advance. If you don't receive a letter, please call our office to schedule the follow-up appointment.  Any Other Special Instructions Will Be Listed Below (If Applicable).

## 2014-06-28 LAB — BASIC METABOLIC PANEL
BUN: 18 mg/dL (ref 6–23)
CALCIUM: 9.8 mg/dL (ref 8.4–10.5)
CO2: 29 meq/L (ref 19–32)
CREATININE: 1.27 mg/dL (ref 0.40–1.50)
Chloride: 107 mEq/L (ref 96–112)
GFR: 73.34 mL/min (ref >60.00)
Glucose, Bld: 87 mg/dL (ref 70–99)
Potassium: 3.8 mEq/L (ref 3.5–5.1)
SODIUM: 143 meq/L (ref 135–145)

## 2014-06-28 LAB — MAGNESIUM: Magnesium: 2.4 mg/dL (ref 1.5–2.5)

## 2014-07-12 ENCOUNTER — Telehealth (HOSPITAL_COMMUNITY): Payer: Self-pay | Admitting: *Deleted

## 2014-07-12 NOTE — Telephone Encounter (Signed)
Patient given detailed instructions per Stress Echo Sheet for test on 07/17/14 at 7:30 am Patient verbalized understanding. Hubbard Robinson, RN

## 2014-07-17 ENCOUNTER — Ambulatory Visit (HOSPITAL_COMMUNITY): Payer: 59 | Attending: Cardiovascular Disease

## 2014-07-17 DIAGNOSIS — I493 Ventricular premature depolarization: Secondary | ICD-10-CM | POA: Insufficient documentation

## 2014-07-18 ENCOUNTER — Encounter: Payer: Self-pay | Admitting: Cardiovascular Disease

## 2015-02-19 MED FILL — DILTIAZEM 24HR ER 360 MG CA: 360 | 90 days supply | Qty: 90 | Fill #1

## 2015-03-22 MED FILL — LISINOPRIL-HCTZ 20-25 MG TA: 20-25 | 90 days supply | Qty: 90 | Fill #3

## 2015-03-27 DIAGNOSIS — N529 Male erectile dysfunction, unspecified: Secondary | ICD-10-CM | POA: Diagnosis not present

## 2015-03-27 DIAGNOSIS — Z125 Encounter for screening for malignant neoplasm of prostate: Secondary | ICD-10-CM | POA: Diagnosis not present

## 2015-03-27 DIAGNOSIS — Z1211 Encounter for screening for malignant neoplasm of colon: Secondary | ICD-10-CM | POA: Diagnosis not present

## 2015-03-27 DIAGNOSIS — I10 Essential (primary) hypertension: Secondary | ICD-10-CM | POA: Diagnosis not present

## 2015-03-27 DIAGNOSIS — Z0000 Encounter for general adult medical examination without abnormal findings: Secondary | ICD-10-CM | POA: Diagnosis not present

## 2015-03-27 DIAGNOSIS — R809 Proteinuria, unspecified: Secondary | ICD-10-CM | POA: Diagnosis not present

## 2015-03-27 DIAGNOSIS — Z23 Encounter for immunization: Secondary | ICD-10-CM | POA: Diagnosis not present

## 2015-05-08 DIAGNOSIS — Z1211 Encounter for screening for malignant neoplasm of colon: Secondary | ICD-10-CM | POA: Diagnosis not present

## 2015-05-10 DIAGNOSIS — S99921A Unspecified injury of right foot, initial encounter: Secondary | ICD-10-CM | POA: Diagnosis not present

## 2015-05-10 MED FILL — DILTIAZEM 24HR CD 360 MG CA: 360 | 90 days supply | Qty: 90 | Fill #0

## 2015-06-17 MED FILL — LISINOPRIL-HCTZ 20-25 MG TA: 20-25 | 90 days supply | Qty: 90 | Fill #4

## 2015-07-15 NOTE — Progress Notes (Signed)
Patient ID: Leonard Campbell, male   DOB: 30-Jan-1951, 65 y.o.   MRN: [id number]   Danger is seen today in followup for paroxysmal atrial fibrillation hypertension. He has maintained sinus rhythm for quite some time. His Mali score is 2 with hypertension and age . His blood pressure well controlled. His wife [name] is a good friend of my wife and I and takes his blood pressure home. Does not follow a particular diet. He is compliant with his medications. He has not noticed any palpitations PND orthopnea chest pain or lower extremity edema. ONe daughter living with him now. Still working at [place]  His primary is Dr Wyline Mood and he had a recent physical with lab work  Does not need any refills  Doing lots of yard work   Asymptomatic PVCls   07/17/14 had normal stress echo   This patients CHA2DS2-VASc Score and unadjusted Ischemic Stroke Rate (% per year) is equal to 2.2 % stroke rate/year from a score of 2  Above score calculated as 1 point each if present [CHF, HTN, DM, Vascular=MI/PAD/Aortic Plaque, Age if 65-74, or Male] Above score calculated as 2 points each if present [Age > 75, or Stroke/TIA/TE]   ROS: Denies fever, malais, weight loss, blurry vision, decreased visual acuity, cough, sputum, SOB, hemoptysis, pleuritic pain, palpitaitons, heartburn, abdominal pain, melena, lower extremity edema, claudication, or rash.  All other systems reviewed and negative  General: Affect appropriate Healthy:  appears stated age 15: normal Neck supple with no adenopathy JVP normal no bruits no thyromegaly Lungs clear with no wheezing and good diaphragmatic motion Heart:  S1/S2 no murmur, no rub, gallop or click PMI normal Abdomen: benighn, BS positve, no tenderness, no AAA no bruit.  No HSM or HJR Distal pulses intact with no bruits No edema Neuro non-focal Skin warm and dry No muscular weakness   Current Outpatient Prescriptions  Medication Sig Dispense Refill  .  lisinopril-hydrochlorothiazide (PRINZIDE,ZESTORETIC) 20-25 MG per tablet Take 1 tablet by mouth daily.     . rivaroxaban (XARELTO) 20 MG TABS tablet Take 1 tablet (20 mg total) by mouth daily with supper. 30 tablet 11   No current facility-administered medications for this visit.    Allergies  Review of patient's allergies indicates no known allergies.  Electrocardiogram: 05/24/12   SR nonspecfic ST/T wave changes  06/27/14  SR rate 63  PVC nonspecific ST/T wave changes QT 376   07/17/15  Atrial flutter rate 47   Assessment and Plan HTN:  Well controlled.  Continue current medications and low sodium Dash type diet.   PVC;s  Asymptomatic  Normal stress echo 5/16 PAF:  In slow flutter today. Asymptomatic Stop cardizem as rate too low.  Start xarelto stop ASA.  Labs today  Including CBC PLT TSH and BMET f/u with me next available will discuss [facility] after 4 weeks anticoagulation  F/U with me next avialable   [employer]

## 2015-07-17 ENCOUNTER — Ambulatory Visit (INDEPENDENT_AMBULATORY_CARE_PROVIDER_SITE_OTHER): Payer: 59 | Admitting: Cardiovascular Disease

## 2015-07-17 ENCOUNTER — Encounter: Payer: Self-pay | Admitting: Cardiovascular Disease

## 2015-07-17 VITALS — BP 140/60 | HR 48 | Ht 72.0 in | Wt 225.8 lb

## 2015-07-17 DIAGNOSIS — I483 Typical atrial flutter: Secondary | ICD-10-CM

## 2015-07-17 DIAGNOSIS — I493 Ventricular premature depolarization: Secondary | ICD-10-CM | POA: Diagnosis not present

## 2015-07-17 DIAGNOSIS — I4892 Unspecified atrial flutter: Secondary | ICD-10-CM | POA: Diagnosis not present

## 2015-07-17 LAB — BASIC METABOLIC PANEL
BUN: 17 mg/dL (ref 7–25)
CALCIUM: 9.3 mg/dL (ref 8.6–10.3)
CO2: 25 mmol/L (ref 20–31)
Chloride: 106 mmol/L (ref 98–110)
Creat: 1.19 mg/dL (ref 0.70–1.25)
GLUCOSE: 97 mg/dL (ref 65–99)
Potassium: 3.7 mmol/L (ref 3.5–5.3)
Sodium: 142 mmol/L (ref 135–146)

## 2015-07-17 LAB — CBC WITH DIFFERENTIAL/PLATELET
BASOS ABS: 0 {cells}/uL (ref 0–200)
Basophils Relative: 0 %
EOS PCT: 5 %
Eosinophils Absolute: 280 cells/uL (ref 15–500)
HEMATOCRIT: 45.5 % (ref 38.5–50.0)
Hemoglobin: 15.9 g/dL (ref 13.2–17.1)
LYMPHS PCT: 46 %
Lymphs Abs: 2576 cells/uL (ref 850–3900)
MCH: 36.3 pg — AB (ref 27.0–33.0)
MCHC: 34.9 g/dL (ref 32.0–36.0)
MCV: 103.9 fL — ABNORMAL HIGH (ref 80.0–100.0)
MPV: 10.6 fL (ref 7.5–12.5)
Monocytes Absolute: 392 cells/uL (ref 200–950)
Monocytes Relative: 7 %
NEUTROS PCT: 42 %
Neutro Abs: 2352 cells/uL (ref 1500–7800)
Platelets: 206 10*3/uL (ref 140–400)
RBC: 4.38 MIL/uL (ref 4.20–5.80)
RDW: 14.9 % (ref 11.0–15.0)
WBC: 5.6 10*3/uL (ref 3.8–10.8)

## 2015-07-17 LAB — TSH: TSH: 0.63 mIU/L (ref 0.40–4.50)

## 2015-07-17 MED ORDER — RIVAROXABAN 20 MG PO TABS: 20.0000 mg | ORAL_TABLET | Freq: Every day | ORAL | Status: DC

## 2015-07-17 MED FILL — XARELTO 20 MG TABLET: 20 | 30 days supply | Qty: 30 | Fill #0

## 2015-07-17 NOTE — Patient Instructions (Addendum)
Medication Instructions:  Your physician has recommended you make the following change in your medication:  1-STOP Aspirin 2-STOP Cardizem 3-START xarelto 20 mg by mouth daily  Labwork: Your physician recommends that you have lab work today. BMET, CBC, TSH  Testing/Procedures: NONE  Follow-Up: Your physician wants you to follow-up in: next available with Dr. Johnsie Cancel.   If you need a refill on your cardiac medications before your next appointment, please call your pharmacy.

## 2015-08-05 ENCOUNTER — Ambulatory Visit (INDEPENDENT_AMBULATORY_CARE_PROVIDER_SITE_OTHER): Payer: 59 | Admitting: Cardiovascular Disease

## 2015-08-05 ENCOUNTER — Encounter: Payer: Self-pay | Admitting: Cardiovascular Disease

## 2015-08-05 VITALS — BP 140/80 | HR 67 | Ht 72.0 in | Wt 225.8 lb

## 2015-08-05 DIAGNOSIS — I483 Typical atrial flutter: Secondary | ICD-10-CM

## 2015-08-05 MED ORDER — LISINOPRIL-HYDROCHLOROTHIAZIDE 20-25 MG PO TABS: 1.0000 | ORAL_TABLET | Freq: Every day | ORAL | Status: DC

## 2015-08-05 NOTE — Patient Instructions (Signed)
Medication Instructions:  Your physician recommends that you continue on your current medications as directed. Please refer to the Current Medication list given to you today.   Labwork: None Ordered   Testing/Procedures: None Ordered   Follow-Up: Your physician recommends that you return for a follow-up appointment on: Monday July 24 at 10:00 am   If you need a refill on your cardiac medications before your next appointment, please call your pharmacy.   Thank you for choosing [facility]! Christen Bame, RN [phone]

## 2015-08-05 NOTE — Progress Notes (Signed)
Patient ID: Leonard Campbell, male   DOB: 1950-11-14, 65 y.o.   MRN: [id number]   Detavious is seen today in followup for paroxysmal atrial fibrillation hypertension. He has maintained sinus rhythm for quite some time. His [place] score is 2 with hypertension and age . His blood pressure well controlled. His wife [name] is a good friend of my wife and I and takes his blood pressure home. Does not follow a particular diet. He is compliant with his medications. He has not noticed any palpitations PND orthopnea chest pain or lower extremity edema. ONe daughter living with him now. Still working at [place]  His primary is Dr Wyline Mood and he had a recent physical with lab work  Does not need any refills  Doing lots of yard work   Asymptomatic PVCls   07/17/14 had normal stress echo   This patients CHA2DS2-VASc Score and unadjusted Ischemic Stroke Rate (% per year) is equal to 2.2 % stroke rate/year from a score of 2  Above score calculated as 1 point each if present [CHF, HTN, DM, Vascular=MI/PAD/Aortic Plaque, Age if 65-74, or Male] Above score calculated as 2 points each if present [Age > 75, or Stroke/TIA/TE]  Today was still in slow flutter rate of 64  Discussed arranging [facility] with him including risk of stroke and needing A pacer since he is slow on no AV nodal drugs. Discussed on phone with his wife [name] who is a Conservation officer, historic buildings [employer]. They are both hesitant to proceed at this time.  I think Al is concerned because he is asymptomatic   ROS: Denies fever, malais, weight loss, blurry vision, decreased visual acuity, cough, sputum, SOB, hemoptysis, pleuritic pain, palpitaitons, heartburn, abdominal pain, melena, lower extremity edema, claudication, or rash.  All other systems reviewed and negative  General: Affect appropriate Healthy:  appears stated age 49: normal Neck supple with no adenopathy JVP normal no bruits no thyromegaly Lungs clear with no wheezing and good diaphragmatic  motion Heart:  S1/S2 no murmur, no rub, gallop or click PMI normal Abdomen: benighn, BS positve, no tenderness, no AAA no bruit.  No HSM or HJR Distal pulses intact with no bruits No edema Neuro non-focal Skin warm and dry No muscular weakness   Current Outpatient Prescriptions  Medication Sig Dispense Refill  . rivaroxaban (XARELTO) 20 MG TABS tablet Take 1 tablet (20 mg total) by mouth daily with supper. 30 tablet 11  . lisinopril-hydrochlorothiazide (PRINZIDE,ZESTORETIC) 20-25 MG tablet Take 1 tablet by mouth daily. 90 tablet 3   No current facility-administered medications for this visit.    Allergies  Review of patient's allergies indicates no known allergies.  Electrocardiogram: 05/24/12   SR nonspecfic ST/T wave changes  06/27/14  SR rate 63  PVC nonspecific ST/T wave changes QT 376   07/17/15  Atrial flutter rate 47  08/05/15 Atrial flutter rate 64   Assessment and Plan HTN:  Well controlled.  Continue current medications and low sodium Dash type diet.   PVC;s  Asymptomatic  Normal stress echo 5/16 PAF:  In slow flutter today still Discussed [facility] and he and wife are hesitant need more time to  Adjust to diagnosis and potential complications since he is asymptomatic There is no harm in  Waiting and I suspect they will be ok with [facility] eventually.    F/U with me next avialable   [name]

## 2015-08-06 ENCOUNTER — Telehealth: Payer: Self-pay | Admitting: Cardiovascular Disease

## 2015-08-06 DIAGNOSIS — Z01812 Encounter for preprocedural laboratory examination: Secondary | ICD-10-CM

## 2015-08-06 NOTE — Telephone Encounter (Signed)
Patient and his wife would like to have cardioversion done next week on Monday or Thursday. Will make sure either day is fine with Dr. Johnsie Cancel, and will schedule.   Patient has been on Xarelto since 07/17/15.

## 2015-08-06 NOTE — Telephone Encounter (Signed)
New message   Royston Bake is calling to schedule the cardio version  Between this week and next week

## 2015-08-07 NOTE — Telephone Encounter (Signed)
F/u  Pt wife following up to speak w/ RN- Please call back and discuss.

## 2015-08-07 NOTE — Telephone Encounter (Signed)
Called patient's spouse back and discussed dates for cardioversion. Patient's spouse prefers next Thursday, will check with Dr. Kyla Balzarine schedule at the hospital to see if Thursday will work out.

## 2015-08-08 NOTE — Telephone Encounter (Signed)
Called patient's spouse back with time and date of procedure. Went over instructions for cardioversion. Patient having cardioversion on 08/15/15 at 9:00, patient knows to be at [facility] at 7:30. Will mail instruction letter to patient. Patient coming in tomorrow for lab work. Sent message to precert.

## 2015-08-08 NOTE — Telephone Encounter (Signed)
Next Thursday is fine

## 2015-08-09 ENCOUNTER — Other Ambulatory Visit (INDEPENDENT_AMBULATORY_CARE_PROVIDER_SITE_OTHER): Payer: 59 | Admitting: *Deleted

## 2015-08-09 DIAGNOSIS — Z01812 Encounter for preprocedural laboratory examination: Secondary | ICD-10-CM

## 2015-08-09 LAB — CBC WITH DIFFERENTIAL/PLATELET
BASOS PCT: 0 %
Basophils Absolute: 0 cells/uL (ref 0–200)
EOS ABS: 208 {cells}/uL (ref 15–500)
Eosinophils Relative: 4 %
HEMATOCRIT: 44.9 % (ref 38.5–50.0)
HEMOGLOBIN: 15.9 g/dL (ref 13.2–17.1)
LYMPHS ABS: 2756 {cells}/uL (ref 850–3900)
Lymphocytes Relative: 53 %
MCH: 35.9 pg — ABNORMAL HIGH (ref 27.0–33.0)
MCHC: 35.4 g/dL (ref 32.0–36.0)
MCV: 101.4 fL — AB (ref 80.0–100.0)
MONO ABS: 364 {cells}/uL (ref 200–950)
MONOS PCT: 7 %
MPV: 12 fL (ref 7.5–12.5)
NEUTROS ABS: 1872 {cells}/uL (ref 1500–7800)
Neutrophils Relative %: 36 %
PLATELETS: 190 10*3/uL (ref 140–400)
RBC: 4.43 MIL/uL (ref 4.20–5.80)
RDW: 15 % (ref 11.0–15.0)
WBC: 5.2 10*3/uL (ref 3.8–10.8)

## 2015-08-09 LAB — BASIC METABOLIC PANEL
BUN: 15 mg/dL (ref 7–25)
CHLORIDE: 108 mmol/L (ref 98–110)
CO2: 26 mmol/L (ref 20–31)
CREATININE: 1.03 mg/dL (ref 0.70–1.25)
Calcium: 9.2 mg/dL (ref 8.6–10.3)
Glucose, Bld: 73 mg/dL (ref 65–99)
Potassium: 3.7 mmol/L (ref 3.5–5.3)
Sodium: 145 mmol/L (ref 135–146)

## 2015-08-09 LAB — PROTIME-INR
INR: 1.2 — ABNORMAL HIGH
Prothrombin Time: 12.2 s — ABNORMAL HIGH (ref 9.0–11.5)

## 2015-08-11 ENCOUNTER — Other Ambulatory Visit: Payer: Self-pay | Admitting: Cardiovascular Disease

## 2015-08-13 MED FILL — XARELTO 20 MG TABLET: 20 | 30 days supply | Qty: 30 | Fill #1

## 2015-08-15 ENCOUNTER — Encounter (HOSPITAL_COMMUNITY): Payer: Self-pay | Admitting: *Deleted

## 2015-08-15 ENCOUNTER — Ambulatory Visit (HOSPITAL_COMMUNITY): Payer: 59 | Admitting: Anesthesiology

## 2015-08-15 ENCOUNTER — Encounter (HOSPITAL_COMMUNITY)
Admission: RE | Disposition: A | Discharge status: Home / Self Care | Payer: Self-pay | Source: Ambulatory Visit | Attending: Cardiovascular Disease

## 2015-08-15 ENCOUNTER — Ambulatory Visit (HOSPITAL_COMMUNITY)
Admission: RE | Admit: 2015-08-15 | Discharge: 2015-08-15 | Disposition: A | Discharge status: Home / Self Care | Payer: 59 | Source: Ambulatory Visit | Attending: Cardiovascular Disease | Admitting: Cardiovascular Disease

## 2015-08-15 DIAGNOSIS — I10 Essential (primary) hypertension: Secondary | ICD-10-CM | POA: Diagnosis not present

## 2015-08-15 DIAGNOSIS — I480 Paroxysmal atrial fibrillation: Secondary | ICD-10-CM | POA: Diagnosis not present

## 2015-08-15 DIAGNOSIS — I4892 Unspecified atrial flutter: Secondary | ICD-10-CM | POA: Insufficient documentation

## 2015-08-15 DIAGNOSIS — Z7901 Long term (current) use of anticoagulants: Secondary | ICD-10-CM | POA: Insufficient documentation

## 2015-08-15 DIAGNOSIS — I4891 Unspecified atrial fibrillation: Secondary | ICD-10-CM | POA: Diagnosis not present

## 2015-08-15 DIAGNOSIS — Z79899 Other long term (current) drug therapy: Secondary | ICD-10-CM | POA: Diagnosis not present

## 2015-08-15 HISTORY — PX: CARDIOVERSION: SHX1299

## 2015-08-15 HISTORY — DX: Adverse effect of unspecified anesthetic, initial encounter: T41.45XA

## 2015-08-15 HISTORY — DX: Other complications of anesthesia, initial encounter: T88.59XA

## 2015-08-15 SURGERY — CARDIOVERSION
Anesthesia: General

## 2015-08-15 MED ORDER — HYDRALAZINE HCL 25 MG PO TABS
25.0000 mg | ORAL_TABLET | Freq: Once | ORAL | Status: AC
  Administered 2015-08-15: 25 mg via ORAL
  Filled 2015-08-15: qty 1

## 2015-08-15 MED ORDER — PROPOFOL 10 MG/ML IV BOLUS
INTRAVENOUS | Status: DC | PRN
  Administered 2015-08-15: 80 mg via INTRAVENOUS

## 2015-08-15 MED ORDER — LIDOCAINE HCL (CARDIAC) 20 MG/ML IV SOLN
INTRAVENOUS | Status: DC | PRN
  Administered 2015-08-15: 100 mg via INTRATRACHEAL

## 2015-08-15 MED ORDER — SODIUM CHLORIDE 0.9 % IV SOLN
INTRAVENOUS | Status: DC
  Administered 2015-08-15 (×2): via INTRAVENOUS

## 2015-08-15 NOTE — Interval H&P Note (Signed)
History and Physical Interval Note:  08/15/2015 9:02 AM  Leonard Campbell  has presented today for surgery, with the diagnosis of A FIB   The various methods of treatment have been discussed with the patient and family. After consideration of risks, benefits and other options for treatment, the patient has consented to  Procedure(s): CARDIOVERSION (N/A) as a surgical intervention .  The patient's history has been reviewed, patient examined, no change in status, stable for surgery.  I have reviewed the patient's chart and labs.  Questions were answered to the patient's satisfaction.     [name]

## 2015-08-15 NOTE — CV Procedure (Signed)
Anesthesia; Propofol/Lidocaine Rhythm flutter rate 72  DCC x 3 120 150 and 200 J biphasic.  Converted each time but then reverted to flutter  No neurologic sequelae Discussed with Dr Lovena Le who will see him to arrange flutter ablation Continue NOAC  [name]

## 2015-08-15 NOTE — Anesthesia Preprocedure Evaluation (Signed)
Anesthesia Evaluation  Patient identified by MRN, date of birth, ID band Patient awake    Reviewed: Allergy & Precautions, NPO status , Patient's Chart, lab work & pertinent test results  Airway Mallampati: II  TM Distance: >3 FB Neck ROM: Full    Dental no notable dental hx.    Pulmonary neg pulmonary ROS,    Pulmonary exam normal breath sounds clear to auscultation       Cardiovascular hypertension, Pt. on medications Normal cardiovascular exam Rhythm:Regular Rate:Normal     Neuro/Psych negative neurological ROS  negative psych ROS   GI/Hepatic negative GI ROS, Neg liver ROS,   Endo/Other  negative endocrine ROS  Renal/GU negative Renal ROS     Musculoskeletal negative musculoskeletal ROS (+)   Abdominal   Peds  Hematology negative hematology ROS (+)   Anesthesia Other Findings   Reproductive/Obstetrics                             Anesthesia Physical Anesthesia Plan  ASA: II  Anesthesia Plan: General   Post-op Pain Management:    Induction: Intravenous  Airway Management Planned:   Additional Equipment:   Intra-op Plan:   Post-operative Plan:   Informed Consent: I have reviewed the patients History and Physical, chart, labs and discussed the procedure including the risks, benefits and alternatives for the proposed anesthesia with the patient or authorized representative who has indicated his/her understanding and acceptance.   Dental advisory given  Plan Discussed with: CRNA  Anesthesia Plan Comments:         Anesthesia Quick Evaluation

## 2015-08-15 NOTE — Discharge Instructions (Signed)
Electrical Cardioversion, Care After °Refer to this sheet in the next few weeks. These instructions provide you with information on caring for yourself after your procedure. Your health care provider may also give you more specific instructions. Your treatment has been planned according to current medical practices, but problems sometimes occur. Call your health care provider if you have any problems or questions after your procedure. °WHAT TO EXPECT AFTER THE PROCEDURE °After your procedure, it is typical to have the following sensations: °· Some redness on the skin where the shocks were delivered. If this is tender, a sunburn lotion or hydrocortisone cream may help. °· Possible return of an abnormal heart rhythm within hours or days after the procedure. °HOME CARE INSTRUCTIONS °· Take medicines only as directed by your health care provider. Be sure you understand how and when to take your medicine. °· Learn how to feel your pulse and check it often. °· Limit your activity for 48 hours after the procedure or as directed by your health care provider. °· Avoid or minimize caffeine and other stimulants as directed by your health care provider. °SEEK MEDICAL CARE IF: °· You feel like your heart is beating too fast or your pulse is not regular. °· You have any questions about your medicines. °· You have bleeding that will not stop. °SEEK IMMEDIATE MEDICAL CARE IF: °· You are dizzy or feel faint. °· It is hard to breathe or you feel short of breath. °· There is a change in discomfort in your chest. °· Your speech is slurred or you have trouble moving an arm or leg on one side of your body. °· You get a serious muscle cramp that does not go away. °· Your fingers or toes turn cold or blue. °  °This information is not intended to replace advice given to you by your health care provider. Make sure you discuss any questions you have with your health care provider. °  °Document Released: 11/23/2012 Document Revised: 02/23/2014  Document Reviewed: 11/23/2012 °Elsevier Interactive Patient Education ©2016 [employer] Inc[employer] ° °

## 2015-08-15 NOTE — Transfer of Care (Signed)
Immediate Anesthesia Transfer of Care Note  Patient: Leonard Campbell  Procedure(s) Performed: Procedure(s): CARDIOVERSION (N/A)  Patient Location: Endoscopy Unit  Anesthesia Type:General  Level of Consciousness: awake, alert  and oriented  Airway & Oxygen Therapy: Patient Spontanous Breathing and Patient connected to nasal cannula oxygen  Post-op Assessment: Report given to RN and Post -op Vital signs reviewed and stable  Post vital signs: Reviewed and stable  Last Vitals:  Filed Vitals:   08/15/15 0816  BP: 176/104  Pulse: 58  Resp: 16    Last Pain: There were no vitals filed for this visit.       Complications: No apparent anesthesia complications

## 2015-08-15 NOTE — H&P (View-Only) (Signed)
Patient ID: Leonard Campbell, male   DOB: 02-07-1951, 65 y.o.   MRN: [id number]   Ahmar is seen today in followup for paroxysmal atrial fibrillation hypertension. He has maintained sinus rhythm for quite some time. His [place] score is 2 with hypertension and age . His blood pressure well controlled. His wife [name] is a good friend of my wife and I and takes his blood pressure home. Does not follow a particular diet. He is compliant with his medications. He has not noticed any palpitations PND orthopnea chest pain or lower extremity edema. ONe daughter living with him now. Still working at [employer]  His primary is Dr Wyline Mood and he had a recent physical with lab work  Does not need any refills  Doing lots of yard work   Asymptomatic PVCls   07/17/14 had normal stress echo   This patients CHA2DS2-VASc Score and unadjusted Ischemic Stroke Rate (% per year) is equal to 2.2 % stroke rate/year from a score of 2  Above score calculated as 1 point each if present [CHF, HTN, DM, Vascular=MI/PAD/Aortic Plaque, Age if 65-74, or Male] Above score calculated as 2 points each if present [Age > 75, or Stroke/TIA/TE]  Today was still in slow flutter rate of 64  Discussed arranging [facility] with him including risk of stroke and needing A pacer since he is slow on no AV nodal drugs. Discussed on phone with his wife [name] who is a Conservation officer, historic buildings [employer]. They are both hesitant to proceed at this time.  I think Al is concerned because he is asymptomatic   ROS: Denies fever, malais, weight loss, blurry vision, decreased visual acuity, cough, sputum, SOB, hemoptysis, pleuritic pain, palpitaitons, heartburn, abdominal pain, melena, lower extremity edema, claudication, or rash.  All other systems reviewed and negative  General: Affect appropriate Healthy:  appears stated age 32: normal Neck supple with no adenopathy JVP normal no bruits no thyromegaly Lungs clear with no wheezing and good diaphragmatic  motion Heart:  S1/S2 no murmur, no rub, gallop or click PMI normal Abdomen: benighn, BS positve, no tenderness, no AAA no bruit.  No HSM or HJR Distal pulses intact with no bruits No edema Neuro non-focal Skin warm and dry No muscular weakness   Current Outpatient Prescriptions  Medication Sig Dispense Refill  . rivaroxaban (XARELTO) 20 MG TABS tablet Take 1 tablet (20 mg total) by mouth daily with supper. 30 tablet 11  . lisinopril-hydrochlorothiazide (PRINZIDE,ZESTORETIC) 20-25 MG tablet Take 1 tablet by mouth daily. 90 tablet 3   No current facility-administered medications for this visit.    Allergies  Review of patient's allergies indicates no known allergies.  Electrocardiogram: 05/24/12   SR nonspecfic ST/T wave changes  06/27/14  SR rate 63  PVC nonspecific ST/T wave changes QT 376   07/17/15  Atrial flutter rate 47  08/05/15 Atrial flutter rate 64   Assessment and Plan HTN:  Well controlled.  Continue current medications and low sodium Dash type diet.   PVC;s  Asymptomatic  Normal stress echo 5/16 PAF:  In slow flutter today still Discussed [facility] and he and wife are hesitant need more time to  Adjust to diagnosis and potential complications since he is asymptomatic There is no harm in  Waiting and I suspect they will be ok with [facility] eventually.    F/U with me next avialable   [name]

## 2015-08-15 NOTE — Anesthesia Postprocedure Evaluation (Signed)
Anesthesia Post Note  Patient: Leonard Campbell  Procedure(s) Performed: Procedure(s) (LRB): CARDIOVERSION (N/A)  Patient location during evaluation: Endoscopy Anesthesia Type: General Level of consciousness: awake and alert and oriented Vital Signs Assessment: post-procedure vital signs reviewed and stable Respiratory status: spontaneous breathing Cardiovascular status: blood pressure returned to baseline Anesthetic complications: no    Last Vitals:  Filed Vitals:   08/15/15 0816  BP: 176/104  Pulse: 58  Resp: 16    Last Pain: There were no vitals filed for this visit.               Clearnce Sorrel

## 2015-08-26 ENCOUNTER — Encounter: Payer: Self-pay | Admitting: Cardiovascular Disease

## 2015-09-03 ENCOUNTER — Telehealth: Payer: Self-pay | Admitting: Cardiovascular Disease

## 2015-09-03 NOTE — Telephone Encounter (Signed)
New message     The wife wants to speak with nurse trying to make since of the appointments for her husband so he needs to see another cardiologyist

## 2015-09-03 NOTE — Telephone Encounter (Signed)
Sent message to Dr. Tanna Furry scheduler that Dr. Johnsie Cancel wants patient to see Dr. Lovena Le to discuss Ablation. Patient's wife is aware someone will be calling her to schedule.

## 2015-09-06 MED FILL — LISINOPRIL-HCTZ 20-25 MG TA: 20-25 | 90 days supply | Qty: 90 | Fill #0

## 2015-09-06 MED FILL — XARELTO 20 MG TABLET: 20 | 30 days supply | Qty: 30 | Fill #2

## 2015-09-09 ENCOUNTER — Encounter: Payer: Self-pay | Admitting: Cardiovascular Disease

## 2015-09-09 ENCOUNTER — Ambulatory Visit: Payer: 59 | Admitting: Cardiovascular Disease

## 2015-09-24 ENCOUNTER — Encounter: Payer: Self-pay | Admitting: Internal Medicine

## 2015-09-26 DIAGNOSIS — D485 Neoplasm of uncertain behavior of skin: Secondary | ICD-10-CM | POA: Diagnosis not present

## 2015-09-26 DIAGNOSIS — W57XXXA Bitten or stung by nonvenomous insect and other nonvenomous arthropods, initial encounter: Secondary | ICD-10-CM | POA: Diagnosis not present

## 2015-09-26 MED FILL — TRIAMCINOLONE 0.1% CREAM: 0.1 | 10 days supply | Qty: 30 | Fill #0

## 2015-10-08 MED FILL — XARELTO 20 MG TABLET: 20 | 30 days supply | Qty: 30 | Fill #3

## 2015-10-09 ENCOUNTER — Encounter: Payer: Self-pay | Admitting: Internal Medicine

## 2015-10-09 ENCOUNTER — Ambulatory Visit (INDEPENDENT_AMBULATORY_CARE_PROVIDER_SITE_OTHER): Payer: 59 | Admitting: Internal Medicine

## 2015-10-09 VITALS — BP 156/96 | HR 71 | Ht 72.0 in | Wt 225.5 lb

## 2015-10-09 DIAGNOSIS — I481 Persistent atrial fibrillation: Secondary | ICD-10-CM | POA: Diagnosis not present

## 2015-10-09 DIAGNOSIS — R2231 Localized swelling, mass and lump, right upper limb: Secondary | ICD-10-CM | POA: Diagnosis not present

## 2015-10-09 DIAGNOSIS — I4819 Other persistent atrial fibrillation: Secondary | ICD-10-CM

## 2015-10-09 NOTE — Patient Instructions (Signed)
Medication Instructions:  Your physician recommends that you continue on your current medications as directed. Please refer to the Current Medication list given to you today.  Labwork: None ordered.  Testing/Procedures: None ordered.  Follow-Up: Your physician recommends that you schedule a follow-up appointment as needed with Dr. Taylor  Any Other Special Instructions Will Be Listed Below (If Applicable).     If you need a refill on your cardiac medications before your next appointment, please call your pharmacy.  

## 2015-10-09 NOTE — Progress Notes (Signed)
HPI Leonard Campbell is referred today by Dr. Johnsie Cancel for evaluation of atrial fib. He is a pleasant 65 yo man with HTN who was found to have atrial fib (ECG machine read flutter) who underwent DCCV several weeks ago and had early return of atrial fib. He presents today for evaluation. He feels well. He exercises regularly and denies chest pain or sob. No syncope. No edema. He did not know that he had returned to NSR. His blood pressure is high today but his wife who is a nurse notes that his blood pressure is good at home.   No Known Allergies   Current Outpatient Prescriptions  Medication Sig Dispense Refill  . lisinopril-hydrochlorothiazide (PRINZIDE,ZESTORETIC) 20-25 MG tablet Take 1 tablet by mouth daily. 90 tablet 3  . rivaroxaban (XARELTO) 20 MG TABS tablet Take 1 tablet (20 mg total) by mouth daily with supper. 30 tablet 11   No current facility-administered medications for this visit.      Past Medical History:  Diagnosis Date  . Achilles bursitis or tendinitis   . ACHILLES TENDON TEAR   . ANKLE PAIN, RIGHT   . Complication of anesthesia    no surgery  . HEEL PAIN, RIGHT   . HYPERTENSION   . PAROXYSMAL ATRIAL FIBRILLATION   . SINUS TARSI SYNDROME, RIGHT FOOT     ROS:   All systems reviewed and negative except as noted in the HPI.   Past Surgical History:  Procedure Laterality Date  . CARDIOVERSION N/A 08/15/2015   Procedure: CARDIOVERSION;  Surgeon: Josue Hector, MD;  Location: [facility] ENDOSCOPY;  Service: Cardiovascular;  Laterality: N/A;  . TONSILLECTOMY       Family History  Problem Relation Age of Onset  . Heart failure Mother   . Hypertension Mother   . Hypertension Brother      Social History   Social History  . Marital status: Married    Spouse name: youland  . Number of children: 2  . Years of education: college   Occupational History  . retired    Social History Main Topics  . Smoking status: Never Smoker  . Smokeless tobacco: Never  Used  . Alcohol use Yes  . Drug use: No  . Sexual activity: Not on file   Other Topics Concern  . Not on file   Social History Narrative  . No narrative on file     BP (!) 156/96   Pulse 71   Ht 6' (1.829 m)   Wt 225 lb 8 oz (102.3 kg)   BMI 30.58 kg/m   Physical Exam:  Well appearing 65 yo man, looking younger than his stated age, NAD HEENT: Unremarkable Neck:  6 cm JVD, no thyromegally Lymphatics:  No adenopathy Back:  No CVA tenderness Lungs:  Clear with no wheezes HEART:  Regular rate rhythm, no murmurs, no rubs, no clicks Abd:  soft, positive bowel sounds, no organomegally, no rebound, no guarding Ext:  2 plus pulses, no edema, no cyanosis, no clubbing Skin:  No rashes no nodules Neuro:  CN II through XII intact, motor grossly intact  EKG - NSR with PVC's   Assess/Plan: 1. Persistent atrial fib - he has spontaneously returned to NSR and did not know it. He will continue xarelto. He is not a candidate for AA drug therapy at this point as he is asymptomatic. Other than making sure that his rate is controlled, no other specific rec's at this point. 2. Pre-op eval - the  patient is low risk for major surgical comp's from pending hand surgery. I recommended he stop the xarelto after his evening dose on Sept. 2, holding the third and fourth, surgery on the fifth. He can restart Xarelto when his hand surgeon deems that his bleeding risk is acceptable. 3. HTN - his blood pressure is not well controlled in the office. I have asked them to take his blood pressure weekly and bring the results in when the patient sees Dr. Johnsie Cancel back.  4. coags - he will continue Xarelto. Will see him back on an as needed basis.  Mikle Bosworth.D.

## 2015-10-10 ENCOUNTER — Other Ambulatory Visit: Payer: Self-pay | Admitting: Orthopedic Surgery

## 2015-10-17 ENCOUNTER — Encounter (HOSPITAL_BASED_OUTPATIENT_CLINIC_OR_DEPARTMENT_OTHER)
Admission: RE | Admit: 2015-10-17 | Discharge: 2015-10-17 | Disposition: A | Discharge status: Home / Self Care | Payer: 59 | Source: Ambulatory Visit | Attending: Orthopedic Surgery | Admitting: Orthopedic Surgery

## 2015-10-17 ENCOUNTER — Encounter (HOSPITAL_BASED_OUTPATIENT_CLINIC_OR_DEPARTMENT_OTHER): Payer: Self-pay | Admitting: *Deleted

## 2015-10-17 DIAGNOSIS — I10 Essential (primary) hypertension: Secondary | ICD-10-CM | POA: Diagnosis not present

## 2015-10-17 DIAGNOSIS — D481 Neoplasm of uncertain behavior of connective and other soft tissue: Secondary | ICD-10-CM | POA: Diagnosis not present

## 2015-10-17 DIAGNOSIS — R2231 Localized swelling, mass and lump, right upper limb: Secondary | ICD-10-CM | POA: Diagnosis present

## 2015-10-17 DIAGNOSIS — I480 Paroxysmal atrial fibrillation: Secondary | ICD-10-CM | POA: Diagnosis not present

## 2015-10-17 LAB — BASIC METABOLIC PANEL
ANION GAP: 7 (ref 5–15)
BUN: 12 mg/dL (ref 6–20)
CHLORIDE: 108 mmol/L (ref 101–111)
CO2: 27 mmol/L (ref 22–32)
Calcium: 9.3 mg/dL (ref 8.9–10.3)
Creatinine, Ser: 1.22 mg/dL (ref 0.61–1.24)
GFR calc non Af Amer: >60 mL/min (ref >60)
Glucose, Bld: 81 mg/dL (ref 65–99)
POTASSIUM: 3.6 mmol/L (ref 3.5–5.1)
SODIUM: 142 mmol/L (ref 135–145)

## 2015-10-18 DIAGNOSIS — I10 Essential (primary) hypertension: Secondary | ICD-10-CM | POA: Diagnosis not present

## 2015-10-18 DIAGNOSIS — I4891 Unspecified atrial fibrillation: Secondary | ICD-10-CM | POA: Diagnosis not present

## 2015-10-18 MED FILL — AMLODIPINE BESYLATE 5 MG TA: 5 | 90 days supply | Qty: 90 | Fill #0

## 2015-10-22 ENCOUNTER — Ambulatory Visit (HOSPITAL_BASED_OUTPATIENT_CLINIC_OR_DEPARTMENT_OTHER): Payer: 59 | Admitting: Anesthesiology

## 2015-10-22 ENCOUNTER — Encounter (HOSPITAL_BASED_OUTPATIENT_CLINIC_OR_DEPARTMENT_OTHER)
Admission: RE | Disposition: A | Discharge status: Home / Self Care | Payer: Self-pay | Source: Ambulatory Visit | Attending: Orthopedic Surgery

## 2015-10-22 ENCOUNTER — Encounter (HOSPITAL_BASED_OUTPATIENT_CLINIC_OR_DEPARTMENT_OTHER): Payer: Self-pay

## 2015-10-22 ENCOUNTER — Ambulatory Visit (HOSPITAL_BASED_OUTPATIENT_CLINIC_OR_DEPARTMENT_OTHER)
Admission: RE | Admit: 2015-10-22 | Discharge: 2015-10-22 | Disposition: A | Discharge status: Home / Self Care | Payer: 59 | Source: Ambulatory Visit | Attending: Orthopedic Surgery | Admitting: Orthopedic Surgery

## 2015-10-22 DIAGNOSIS — I10 Essential (primary) hypertension: Secondary | ICD-10-CM | POA: Diagnosis not present

## 2015-10-22 DIAGNOSIS — I4891 Unspecified atrial fibrillation: Secondary | ICD-10-CM | POA: Diagnosis not present

## 2015-10-22 DIAGNOSIS — D481 Neoplasm of uncertain behavior of connective and other soft tissue: Secondary | ICD-10-CM | POA: Diagnosis not present

## 2015-10-22 DIAGNOSIS — D2111 Benign neoplasm of connective and other soft tissue of right upper limb, including shoulder: Secondary | ICD-10-CM | POA: Diagnosis not present

## 2015-10-22 DIAGNOSIS — R2231 Localized swelling, mass and lump, right upper limb: Secondary | ICD-10-CM | POA: Diagnosis not present

## 2015-10-22 DIAGNOSIS — I480 Paroxysmal atrial fibrillation: Secondary | ICD-10-CM | POA: Insufficient documentation

## 2015-10-22 HISTORY — PX: MASS EXCISION: SHX2000

## 2015-10-22 SURGERY — EXCISION MASS
Anesthesia: Monitor Anesthesia Care | Site: Thumb | Laterality: Right

## 2015-10-22 MED ORDER — CEFAZOLIN SODIUM-DEXTROSE 2-3 GM-% IV SOLR
INTRAVENOUS | Status: DC | PRN
  Administered 2015-10-22: 2 g via INTRAVENOUS

## 2015-10-22 MED ORDER — CEFAZOLIN SODIUM-DEXTROSE 2-4 GM/100ML-% IV SOLN
INTRAVENOUS | Status: AC
  Filled 2015-10-22: qty 100

## 2015-10-22 MED ORDER — MIDAZOLAM HCL 5 MG/5ML IJ SOLN
INTRAMUSCULAR | Status: DC | PRN
  Administered 2015-10-22: 2 mg via INTRAVENOUS

## 2015-10-22 MED ORDER — MIDAZOLAM HCL 2 MG/2ML IJ SOLN
INTRAMUSCULAR | Status: AC
  Filled 2015-10-22: qty 2

## 2015-10-22 MED ORDER — MIDAZOLAM HCL 2 MG/2ML IJ SOLN: 1.0000 mg | INTRAMUSCULAR | Status: DC | PRN

## 2015-10-22 MED ORDER — PROPOFOL 500 MG/50ML IV EMUL
INTRAVENOUS | Status: DC | PRN
  Administered 2015-10-22: 100 ug/kg/min via INTRAVENOUS

## 2015-10-22 MED ORDER — FENTANYL CITRATE (PF) 100 MCG/2ML IJ SOLN
INTRAMUSCULAR | Status: AC
  Filled 2015-10-22: qty 2

## 2015-10-22 MED ORDER — BUPIVACAINE HCL (PF) 0.25 % IJ SOLN
INTRAMUSCULAR | Status: DC | PRN
  Administered 2015-10-22: 8 mL

## 2015-10-22 MED ORDER — CEFAZOLIN SODIUM-DEXTROSE 2-4 GM/100ML-% IV SOLN: 2.0000 g | INTRAVENOUS | Status: DC

## 2015-10-22 MED ORDER — LIDOCAINE 2% (20 MG/ML) 5 ML SYRINGE
INTRAMUSCULAR | Status: AC
  Filled 2015-10-22: qty 5

## 2015-10-22 MED ORDER — GLYCOPYRROLATE 0.2 MG/ML IJ SOLN: 0.2000 mg | Freq: Once | INTRAMUSCULAR | Status: DC | PRN

## 2015-10-22 MED ORDER — FENTANYL CITRATE (PF) 100 MCG/2ML IJ SOLN: 25.0000 ug | INTRAMUSCULAR | Status: DC | PRN

## 2015-10-22 MED ORDER — FENTANYL CITRATE (PF) 100 MCG/2ML IJ SOLN
INTRAMUSCULAR | Status: DC | PRN
  Administered 2015-10-22: 100 ug via INTRAVENOUS

## 2015-10-22 MED ORDER — FENTANYL CITRATE (PF) 100 MCG/2ML IJ SOLN: 50.0000 ug | INTRAMUSCULAR | Status: DC | PRN

## 2015-10-22 MED ORDER — LACTATED RINGERS IV SOLN
INTRAVENOUS | Status: DC
  Administered 2015-10-22 (×2): via INTRAVENOUS

## 2015-10-22 MED ORDER — LIDOCAINE HCL (PF) 0.5 % IJ SOLN
INTRAMUSCULAR | Status: DC | PRN
  Administered 2015-10-22: 30 mg via INTRAVENOUS

## 2015-10-22 MED ORDER — MEPERIDINE HCL 25 MG/ML IJ SOLN: 6.2500 mg | INTRAMUSCULAR | Status: DC | PRN

## 2015-10-22 MED ORDER — HYDROCODONE-ACETAMINOPHEN 5-325 MG PO TABS: 1.0000 | ORAL_TABLET | Freq: Four times a day (QID) | ORAL | 0 refills | Status: DC | PRN

## 2015-10-22 MED ORDER — SCOPOLAMINE 1 MG/3DAYS TD PT72: 1.0000 | MEDICATED_PATCH | Freq: Once | TRANSDERMAL | Status: DC | PRN

## 2015-10-22 MED ORDER — CHLORHEXIDINE GLUCONATE 4 % EX LIQD: 60.0000 mL | Freq: Once | CUTANEOUS | Status: DC

## 2015-10-22 MED ORDER — PROPOFOL 500 MG/50ML IV EMUL
INTRAVENOUS | Status: AC
  Filled 2015-10-22: qty 50

## 2015-10-22 MED FILL — HYDROCODON-APAP 5-325: 5-325 | 5 days supply | Qty: 20 | Fill #0

## 2015-10-22 SURGICAL SUPPLY — 40 items
BANDAGE COBAN STERILE 2 (GAUZE/BANDAGES/DRESSINGS) IMPLANT
BLADE SURG 15 STRL LF DISP TIS (BLADE) ×1 IMPLANT
BLADE SURG 15 STRL SS (BLADE) ×2
BNDG COHESIVE 1X5 TAN STRL LF (GAUZE/BANDAGES/DRESSINGS) ×3 IMPLANT
BNDG COHESIVE 3X5 TAN STRL LF (GAUZE/BANDAGES/DRESSINGS) IMPLANT
BNDG ESMARK 4X9 LF (GAUZE/BANDAGES/DRESSINGS) IMPLANT
BNDG GAUZE ELAST 4 BULKY (GAUZE/BANDAGES/DRESSINGS) IMPLANT
CHLORAPREP W/TINT 26ML (MISCELLANEOUS) ×3 IMPLANT
CORDS BIPOLAR (ELECTRODE) ×3 IMPLANT
COVER BACK TABLE 60X90IN (DRAPES) ×3 IMPLANT
COVER MAYO STAND STRL (DRAPES) ×3 IMPLANT
CUFF TOURNIQUET SINGLE 18IN (TOURNIQUET CUFF) ×3 IMPLANT
DECANTER SPIKE VIAL GLASS SM (MISCELLANEOUS) IMPLANT
DRAIN PENROSE 1/2X12 LTX STRL (WOUND CARE) IMPLANT
DRAPE EXTREMITY T 121X128X90 (DRAPE) ×3 IMPLANT
DRAPE SURG 17X23 STRL (DRAPES) ×3 IMPLANT
GAUZE SPONGE 4X4 12PLY STRL (GAUZE/BANDAGES/DRESSINGS) ×3 IMPLANT
GAUZE XEROFORM 1X8 LF (GAUZE/BANDAGES/DRESSINGS) ×3 IMPLANT
GLOVE BIOGEL PI IND STRL 8.5 (GLOVE) ×1 IMPLANT
GLOVE BIOGEL PI INDICATOR 8.5 (GLOVE) ×2
GLOVE SURG ORTHO 8.0 STRL STRW (GLOVE) ×3 IMPLANT
GOWN STRL REUS W/ TWL LRG LVL3 (GOWN DISPOSABLE) ×1 IMPLANT
GOWN STRL REUS W/TWL LRG LVL3 (GOWN DISPOSABLE) ×2
GOWN STRL REUS W/TWL XL LVL3 (GOWN DISPOSABLE) ×3 IMPLANT
NDL SAFETY ECLIPSE 18X1.5 (NEEDLE) IMPLANT
NEEDLE HYPO 18GX1.5 SHARP (NEEDLE)
NEEDLE PRECISIONGLIDE 27X1.5 (NEEDLE) ×3 IMPLANT
NS IRRIG 1000ML POUR BTL (IV SOLUTION) ×3 IMPLANT
PACK BASIN DAY SURGERY FS (CUSTOM PROCEDURE TRAY) ×3 IMPLANT
PAD CAST 3X4 CTTN HI CHSV (CAST SUPPLIES) IMPLANT
PADDING CAST COTTON 3X4 STRL (CAST SUPPLIES)
SPLINT PLASTER CAST XFAST 3X15 (CAST SUPPLIES) IMPLANT
SPLINT PLASTER XTRA FASTSET 3X (CAST SUPPLIES)
STOCKINETTE 4X48 STRL (DRAPES) ×3 IMPLANT
SUT ETHILON 4 0 PS 2 18 (SUTURE) ×3 IMPLANT
SUT VIC AB 4-0 P2 18 (SUTURE) IMPLANT
SYR BULB 3OZ (MISCELLANEOUS) ×3 IMPLANT
SYR CONTROL 10ML LL (SYRINGE) ×3 IMPLANT
TOWEL OR 17X24 6PK STRL BLUE (TOWEL DISPOSABLE) ×3 IMPLANT
UNDERPAD 30X30 (UNDERPADS AND DIAPERS) ×3 IMPLANT

## 2015-10-22 NOTE — Anesthesia Preprocedure Evaluation (Signed)
Anesthesia Evaluation  Patient identified by MRN, date of birth, ID band Patient awake    Reviewed: Allergy & Precautions, NPO status , Patient's Chart, lab work & pertinent test results  Airway Mallampati: I  TM Distance: >3 FB Neck ROM: Full    Dental  (+) Teeth Intact, Dental Advisory Given   Pulmonary    breath sounds clear to auscultation       Cardiovascular hypertension, Pt. on medications + dysrhythmias Atrial Fibrillation  Rhythm:Regular Rate:Normal     Neuro/Psych    GI/Hepatic   Endo/Other    Renal/GU      Musculoskeletal   Abdominal   Peds  Hematology   Anesthesia Other Findings   Reproductive/Obstetrics                             Anesthesia Physical Anesthesia Plan  ASA: III  Anesthesia Plan: Bier Block and MAC   Post-op Pain Management:    Induction: Intravenous  Airway Management Planned: Simple Face Mask  Additional Equipment:   Intra-op Plan:   Post-operative Plan:   Informed Consent: I have reviewed the patients History and Physical, chart, labs and discussed the procedure including the risks, benefits and alternatives for the proposed anesthesia with the patient or authorized representative who has indicated his/her understanding and acceptance.   Dental advisory given  Plan Discussed with: CRNA, Anesthesiologist and Surgeon  Anesthesia Plan Comments:         Anesthesia Quick Evaluation

## 2015-10-22 NOTE — Discharge Instructions (Addendum)

## 2015-10-22 NOTE — Transfer of Care (Signed)
Immediate Anesthesia Transfer of Care Note  Patient: Leonard Campbell  Procedure(s) Performed: Procedure(s): EXCISION MASS RIGHT THUMB (Right)  Patient Location: PACU  Anesthesia Type:Bier block  Level of Consciousness: awake, oriented, sedated and patient cooperative  Airway & Oxygen Therapy: Patient Spontanous Breathing and Patient connected to face mask oxygen  Post-op Assessment: Report given to RN and Post -op Vital signs reviewed and stable  Post vital signs: Reviewed and stable  Last Vitals:  Vitals:   10/22/15 0950  BP: (!) 177/96  Pulse: 69  Resp: 18  Temp: 36.4 C    Last Pain:  Vitals:   10/22/15 0950  TempSrc: Oral         Complications: No apparent anesthesia complications

## 2015-10-22 NOTE — H&P (Signed)
  Leonard Campbell is an 65 y.o. male.   Chief Complaint: mass rt thumb HPI: Leonard Campbell is a 65 year old right-hand-dominant male referred by Dr. Ubaldo Campbell. He has a complaint of a mass on the volar ulnar aspect at the IP joint area of his right thumb. He states this been present for the past 8-9 months. He recalls no history of injury. He is not complaining of any pain or discomfort with it. His major complaint is inability to get his thumb and his bowling ball. He has no history of diabetes thyroid problems arthritis or gout. His family history is negative for diabetes thyroid problems arthritis and gout. He has a history of hypertension.  Past Medical History:  Diagnosis Date  . Hypertension   Past Surgical History:  Procedure Laterality Date  . TONSILLECTOMY       Past Medical History:  Diagnosis Date  . Achilles bursitis or tendinitis   . ACHILLES TENDON TEAR   . ANKLE PAIN, RIGHT   . Complication of anesthesia    no surgery  . HEEL PAIN, RIGHT   . HYPERTENSION   . PAROXYSMAL ATRIAL FIBRILLATION   . SINUS TARSI SYNDROME, RIGHT FOOT     Past Surgical History:  Procedure Laterality Date  . CARDIOVERSION N/A 08/15/2015   Procedure: CARDIOVERSION;  Surgeon: Leonard Hector, MD;  Location: [facility] ENDOSCOPY;  Service: Cardiovascular;  Laterality: N/A;  . TONSILLECTOMY      Family History  Problem Relation Age of Onset  . Heart failure Mother   . Hypertension Mother   . Hypertension Brother    Social History:  reports that he has never smoked. He has never used smokeless tobacco. He reports that he drinks alcohol. He reports that he does not use drugs.  Allergies: No Known Allergies  No prescriptions prior to admission.    No results found for this or any previous visit (from the past 48 hour(s)).  No results found.   Pertinent items are noted in HPI.  Height 6' (1.829 m), weight 102.1 kg (225 lb).  General appearance: alert, cooperative and appears stated age Head:  Normocephalic, without obvious abnormality Neck: no JVD Resp: clear to auscultation bilaterally Cardio: regular rate and rhythm, S1, S2 normal, no murmur, click, rub or gallop GI: soft, non-tender; bowel sounds normal; no masses,  no organomegaly Extremities: mass right thumb Pulses: 2+ and symmetric Skin: Skin color, texture, turgor normal. No rashes or lesions Neurologic: Grossly normal Incision/Wound: na  Assessment/Plan 1. Mass of hand, right    Plan: Plan we have discussed with him the possibility of this being a giant cell tumor or an epidermal inclusion cyst. He would like to have this removed pre-peri-and postoperative course been discussed along with risks and complications. He is aware that there is no guarantee to the surgery the possibility of infection recurrence injury to arteries nerves tendons incomplete relief of symptoms dystrophy the possibility of scar tenderness. Dr. Ubaldo Campbell notes are reviewed. He would like to proceed he is scheduled for excision mass right thumb is an outpatient under regional anesthesia. Questions are encouraged and answered to his satisfaction      Leonard Campbell R 10/22/2015, 9:30 AM

## 2015-10-22 NOTE — Brief Op Note (Signed)
10/22/2015  12:01 PM  PATIENT:  Leonard Campbell  65 y.o. male  PRE-OPERATIVE DIAGNOSIS:  mass right thumb   POST-OPERATIVE DIAGNOSIS:  mass right thumb  PROCEDURE:  Procedure(s): EXCISION MASS RIGHT THUMB (Right)  SURGEON:  Surgeon(s) and Role:    * Daryll Brod, MD - Primary  PHYSICIAN ASSISTANT:   ASSISTANTS: none   ANESTHESIA:   local and regional  EBL:  No intake/output data recorded.  BLOOD ADMINISTERED:none  DRAINS: none   LOCAL MEDICATIONS USED:  BUPIVICAINE   SPECIMEN:  Excision  DISPOSITION OF SPECIMEN:  PATHOLOGY  COUNTS:  YES  TOURNIQUET:   Total Tourniquet Time Documented: Forearm (Right) - 29 minutes Total: Forearm (Right) - 29 minutes   DICTATION: .Other Dictation: Dictation Number dictated 10/22/15 at  12:06  PLAN OF CARE: Discharge to home after PACU  PATIENT DISPOSITION:  PACU - hemodynamically stable.

## 2015-10-22 NOTE — Op Note (Signed)
dictated 10/22/15 at  12:06

## 2015-10-23 ENCOUNTER — Encounter (HOSPITAL_BASED_OUTPATIENT_CLINIC_OR_DEPARTMENT_OTHER): Payer: Self-pay | Admitting: Orthopedic Surgery

## 2015-10-23 NOTE — Anesthesia Postprocedure Evaluation (Signed)
Anesthesia Post Note  Patient: Leonard Campbell  Procedure(s) Performed: Procedure(s) (LRB): EXCISION MASS RIGHT THUMB (Right)  Patient location during evaluation: PACU Anesthesia Type: MAC Level of consciousness: awake and alert Pain management: pain level controlled Vital Signs Assessment: post-procedure vital signs reviewed and stable Respiratory status: spontaneous breathing, nonlabored ventilation and respiratory function stable Cardiovascular status: stable and blood pressure returned to baseline Anesthetic complications: no    Last Vitals:  Vitals:   10/22/15 1230 10/22/15 1258  BP: (!) 155/88 (!) 170/95  Pulse: 67 70  Resp: 16 18  Temp:  36.4 C    Last Pain:  Vitals:   10/22/15 1258  TempSrc:   PainSc: 0-No pain                 Jeoffrey Eleazer A

## 2015-10-23 NOTE — Op Note (Signed)
NAME:  Leonard Campbell, Leonard Campbell NO.:  [id number]  MEDICAL RECORD NO.:  [id number]  LOCATION:                                 FACILITY:[facility] PHYSICIAN:  Daryll Brod, M.D.       DATE OF BIRTH:  Jul 11, 1950  DATE OF PROCEDURE:  10/22/2015 DATE OF DISCHARGE:                              OPERATIVE REPORT   PREOPERATIVE DIAGNOSIS:  Mass, right thumb.  POSTOPERATIVE DIAGNOSIS:  Mass, right thumb.  OPERATION:  Excision of mass, right thumb.  SURGEON:  Daryll Brod, M.D.  ANESTHESIA:  Forearm IV regional with metacarpal block.  ANESTHESIOLOGIST:  Lorrene Reid, M.D.  HISTORY:  The patient is a 65 year old male with a history of a mass on the IP joint, distal phalanx of his right thumb ulnar aspect, this has been gradually enlarging.  He states that he bowls and it is becoming difficult for him to hold the bowling ball with a mass present.  He is desirous of having this removed.  He is aware that there was no guarantee with the surgery; the possibility of infection; recurrence of injury to arteries, nerves, tendons; incomplete relief of symptoms and dystrophy.  PROCEDURE IN DETAIL:  The patient was brought to the operating room, where a forearm-based IV regional anesthetic was carried out without difficulty.  He was prepped with ChloraPrep in a supine position.  This was done after a forearm IV regional anesthetic was carried out without difficulty under the direction of Dr. Al Corpus.  He had been seen in the preoperative area.  His extremity had been marked by both patient and surgeon and antibiotic had been given.  After time-out was taken, confirming the patient and procedure, the incision was made on the ulnar aspect of his thumb, proximal phalanx and carried across the pulp.  This was done after he did have some feeling and metacarpal block was given with 0.25% bupivacaine without epinephrine.  9 mL was used.  After adequate anesthesia was afforded, the dissection  was carried down through subcutaneous tissue until a multilobulated yellow-brown tan mass was immediately encountered.  This was bluntly dissected free.  The digital nerve was identified and protected along with the artery.  The dissection was carried around the mass, fully freeing this.  This was excised.  It appeared to go into the volar plate.  This area was opened and debrided.  This was done with a hemostatic rongeur.  Specimen was sent to Pathology.  It measured approximately 1.4 cm in diameter.  The wound was copiously irrigated with saline.  The wound was then closed with interrupted 4-0 nylon sutures.  This was done protecting the ulnar digital nerve at all times.  A sterile compressive dressing and splint to the thumb was applied.  The patient tolerated the procedure well and was taken to the recovery room for observation in satisfactory condition.  He was discharged to home to return to the [facility] in 1 week, on Nor[place].    ______________________________ Daryll Brod, M.D.   ______________________________ Daryll Brod, M.D.    GK/MEDQ  D:  10/22/2015  T:  10/23/2015  Job:  [id number]

## 2015-11-07 MED FILL — XARELTO 20 MG TABLET: 20 | 30 days supply | Qty: 30 | Fill #4

## 2015-11-12 NOTE — Progress Notes (Signed)
Patient ID: Leonard Campbell, male   DOB: 1950/09/14, 65 y.o.   MRN: [id number]   Leonard Campbell is seen today in followup for paroxysmal atrial fibrillation hypertension. He has maintained sinus rhythm for quite some time. His [place] score is 2 with hypertension and age . His blood pressure well controlled. His wife [name] is a good friend of my wife and I and takes his blood pressure home. Does not follow a particular diet. He is compliant with his medications. He has not noticed any palpitations PND orthopnea chest pain or lower extremity edema. ONe daughter living with him now. Still working at [employer]  His primary is Dr Wyline Mood and he had a recent physical with lab work  Does not need any refills  Doing lots of yard work   Asymptomatic PVCls   07/17/14 had normal stress echo   This patients CHA2DS2-VASc Score and unadjusted Ischemic Stroke Rate (% per year) is equal to 2.2 % stroke rate/year from a score of 2  Above score calculated as 1 point each if present [CHF, HTN, DM, Vascular=MI/PAD/Aortic Plaque, Age if 65-74, or Male] Above score calculated as 2 points each if present [Age > 75, or Stroke/TIA/TE]  Failed [facility] on 08/15/15 Seen by Dr Lovena Le who thought he was in afib not flutter and  Since not symptomatic did not favor AAT or ablation  BP was elevated and meds adjusted by primary  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  All other systems reviewed and negative  General: Affect appropriate Healthy:  appears stated age 36: normal Neck supple with no adenopathy JVP normal no bruits no thyromegaly Lungs clear with no wheezing and good diaphragmatic motion Heart:  S1/S2 no murmur, no rub, gallop or click PMI normal Abdomen: benighn, BS positve,  no tenderness, no AAA no bruit.  No HSM or HJR Distal pulses intact with no bruits No edema Neuro non-focal Skin warm and dry No muscular weakness   Current Outpatient Prescriptions  Medication Sig Dispense Refill  . amLODipine-benazepril (LOTREL) 10-20 MG capsule Take 1 capsule by mouth daily.    Marland Kitchen lisinopril-hydrochlorothiazide (PRINZIDE,ZESTORETIC) 20-25 MG tablet Take 1 tablet by mouth daily. 90 tablet 3  . rivaroxaban (XARELTO) 20 MG TABS tablet Take 1 tablet (20 mg total) by mouth daily with supper. 30 tablet 11   No current facility-administered medications for this visit.     Allergies  Review of patient's allergies indicates no known allergies.  Electrocardiogram: 05/24/12   SR nonspecfic ST/T wave changes  06/27/14  SR rate 63  PVC nonspecific ST/T wave changes QT 376   07/17/15  Atrial flutter rate 47  08/05/15 Atrial flutter rate 64  10/09/15 SR rate 71 PVC LVH   Assessment and Plan HTN:  Well controlled.  Continue current medications and low sodium Dash type diet.   PVC;s  Asymptomatic  Normal stress echo 5/16 PAF:  Failed [place] and spontaneous conversion when seen by Dr Lovena Le on 10/09/15  His note indicated no AAT due to lack of symptoms On xarelto  Hand:  Post excision of mass on right hand by Fredna Dow held xarelto  With no issues   F/U with me  6 months    [name]

## 2015-11-18 ENCOUNTER — Ambulatory Visit (INDEPENDENT_AMBULATORY_CARE_PROVIDER_SITE_OTHER): Payer: 59 | Admitting: Cardiovascular Disease

## 2015-11-18 ENCOUNTER — Telehealth: Payer: Self-pay | Admitting: Cardiovascular Disease

## 2015-11-18 ENCOUNTER — Encounter: Payer: Self-pay | Admitting: Cardiovascular Disease

## 2015-11-18 VITALS — BP 140/74 | HR 76 | Ht 72.0 in | Wt 226.8 lb

## 2015-11-18 DIAGNOSIS — I10 Essential (primary) hypertension: Secondary | ICD-10-CM | POA: Diagnosis not present

## 2015-11-18 DIAGNOSIS — I481 Persistent atrial fibrillation: Secondary | ICD-10-CM | POA: Diagnosis not present

## 2015-11-18 DIAGNOSIS — Z23 Encounter for immunization: Secondary | ICD-10-CM | POA: Diagnosis not present

## 2015-11-18 DIAGNOSIS — I4891 Unspecified atrial fibrillation: Secondary | ICD-10-CM | POA: Diagnosis not present

## 2015-11-18 DIAGNOSIS — I4819 Other persistent atrial fibrillation: Secondary | ICD-10-CM

## 2015-11-18 NOTE — Addendum Note (Signed)
Addended by: Aris Georgia, Taro Hidrogo L on: 11/18/2015 02:37 PM   Modules accepted: Orders

## 2015-11-18 NOTE — Telephone Encounter (Signed)
Dr. Darlys Gales with [facility] call to clarify patient's medication list. Made sure patient's list has amlodipine 5 mg, which patient is taking. Patient is no longer taking amlodipine-benazepril. Patient's medication list reflects these correction.

## 2015-11-18 NOTE — Telephone Encounter (Signed)
New message      Dr Darlys Gales is requesting the nurse to return his call.  Patient was seen this am

## 2015-11-18 NOTE — Patient Instructions (Addendum)

## 2015-12-08 MED FILL — LISINOPRIL-HCTZ 20-25 MG TA: 20-25 | 90 days supply | Qty: 90 | Fill #1

## 2015-12-09 MED FILL — XARELTO 20 MG TABLET: 20 | 30 days supply | Qty: 30 | Fill #5

## 2016-01-07 MED FILL — XARELTO 20 MG TABLET: 20 | 30 days supply | Qty: 30 | Fill #6

## 2016-01-07 MED FILL — AMLODIPINE BESYLATE 5 MG TA: 5 | 90 days supply | Qty: 90 | Fill #1

## 2016-02-12 MED FILL — XARELTO 20 MG TABLET: 20 | 30 days supply | Qty: 30 | Fill #7

## 2016-03-11 MED FILL — LISINOPRIL-HCTZ 20-25 MG TA: 20-25 | 90 days supply | Qty: 90 | Fill #2

## 2016-03-11 MED FILL — XARELTO 20 MG TABLET: 20 | 30 days supply | Qty: 30 | Fill #8

## 2016-03-27 DIAGNOSIS — Z125 Encounter for screening for malignant neoplasm of prostate: Secondary | ICD-10-CM | POA: Diagnosis not present

## 2016-03-27 DIAGNOSIS — I4891 Unspecified atrial fibrillation: Secondary | ICD-10-CM | POA: Diagnosis not present

## 2016-03-27 DIAGNOSIS — Z0000 Encounter for general adult medical examination without abnormal findings: Secondary | ICD-10-CM | POA: Diagnosis not present

## 2016-03-27 DIAGNOSIS — Z23 Encounter for immunization: Secondary | ICD-10-CM | POA: Diagnosis not present

## 2016-03-27 DIAGNOSIS — Z1211 Encounter for screening for malignant neoplasm of colon: Secondary | ICD-10-CM | POA: Diagnosis not present

## 2016-03-27 DIAGNOSIS — R809 Proteinuria, unspecified: Secondary | ICD-10-CM | POA: Diagnosis not present

## 2016-03-27 DIAGNOSIS — I10 Essential (primary) hypertension: Secondary | ICD-10-CM | POA: Diagnosis not present

## 2016-03-27 DIAGNOSIS — N529 Male erectile dysfunction, unspecified: Secondary | ICD-10-CM | POA: Diagnosis not present

## 2016-04-06 MED FILL — XARELTO 20 MG TABLET: 20 | 30 days supply | Qty: 30 | Fill #9

## 2016-04-06 MED FILL — AMLODIPINE BESYLATE 5 MG TA: 5 | 90 days supply | Qty: 90 | Fill #2

## 2016-04-27 DIAGNOSIS — I10 Essential (primary) hypertension: Secondary | ICD-10-CM | POA: Diagnosis not present

## 2016-05-12 MED FILL — AMLODIPINE BESYLATE 10 MG T: 10 | 90 days supply | Qty: 90 | Fill #0

## 2016-05-12 MED FILL — XARELTO 20 MG TABLET: 20 | 30 days supply | Qty: 30 | Fill #10

## 2016-05-18 ENCOUNTER — Ambulatory Visit: Payer: 59 | Admitting: Cardiovascular Disease

## 2016-05-22 ENCOUNTER — Encounter: Payer: Self-pay | Admitting: *Deleted

## 2016-06-04 DIAGNOSIS — K625 Hemorrhage of anus and rectum: Secondary | ICD-10-CM | POA: Diagnosis not present

## 2016-06-04 MED FILL — ANUSOL-HC 25 MG SUPPOSITORY: 25 | 5 days supply | Qty: 10 | Fill #0

## 2016-06-08 MED FILL — LISINOPRIL-HCTZ 20-25 MG TA: 20-25 | 90 days supply | Qty: 90 | Fill #3

## 2016-06-08 MED FILL — XARELTO 20 MG TABLET: 20 | 30 days supply | Qty: 30 | Fill #11

## 2016-06-08 NOTE — Progress Notes (Signed)
Patient ID: Leonard Campbell, male   DOB: 10/04/1950, 66 y.o.   MRN: [id number]   Leonard Campbell is seen today in followup for paroxysmal atrial fibrillation hypertension. He has maintained sinus rhythm for quite some time. His [place] score is 2 with hypertension and age . His blood pressure well controlled. His wife Leonard Campbell is a good friend of my wife and I and takes his blood pressure home. Does not follow a particular diet. He is compliant with his medications. He has not noticed any palpitations PND orthopnea chest pain or lower extremity edema. One daughter living with him now. Still working at [employer]  His primary is Dr Wyline Mood and he had a recent physical with lab work  Does not need any refills  Doing lots of yard work   Asymptomatic PVCls   07/17/14 had normal stress echo   This patients CHA2DS2-VASc Score and unadjusted Ischemic Stroke Rate (% per year) is equal to 2.2 % stroke rate/year from a score of 2  Above score calculated as 1 point each if present [CHF, HTN, DM, Vascular=MI/PAD/Aortic Plaque, Age if 65-74, or Male] Above score calculated as 2 points each if present [Age > 75, or Stroke/TIA/TE]  Failed [facility] on 08/15/15 Seen by Dr Lovena Le who thought he was in afib not flutter and  Since not symptomatic did not favor AAT or ablation    ROS: Denies fever, malais, weight loss, blurry vision, decreased visual acuity, cough, sputum, SOB, hemoptysis, pleuritic pain, palpitaitons, heartburn, abdominal pain, melena, lower extremity edema, claudication, or rash.  All other systems reviewed and negative  General: Affect appropriate Healthy:  appears stated age 24: normal Neck supple with no adenopathy JVP normal no bruits no thyromegaly Lungs clear with no wheezing and good diaphragmatic motion Heart:  S1/S2 no murmur, no rub, gallop or click PMI normal Abdomen: benighn, BS positve, no tenderness, no AAA no bruit.  No HSM or HJR Distal pulses intact with no bruits No edema Neuro  non-focal Skin warm and dry No muscular weakness   Current Outpatient Prescriptions  Medication Sig Dispense Refill  . amLODipine (NORVASC) 10 MG tablet Take 10 mg by mouth daily.  3  . lisinopril-hydrochlorothiazide (PRINZIDE,ZESTORETIC) 20-25 MG tablet Take 1 tablet by mouth daily. 90 tablet 3  . rivaroxaban (XARELTO) 20 MG TABS tablet Take 1 tablet (20 mg total) by mouth daily with supper. 30 tablet 11   No current facility-administered medications for this visit.     Allergies  Patient has no known allergies.  Electrocardiogram: 05/24/12   SR nonspecfic ST/T wave changes  06/27/14  SR rate 63  PVC nonspecific ST/T wave changes QT 376   07/17/15  Atrial flutter rate 47  08/05/15 Atrial flutter rate 64  10/09/15 SR rate 71 PVC LVH   Assessment and Plan  HTN:  Well controlled.  Continue current medications and low sodium Dash type diet.   PVC;s  Asymptomatic  Normal stress echo 5/16 PAF:  Failed DCC and spontaneous conversion when seen by Dr Lovena Le on 10/09/15  His note indicated no AAT due to lack of symptoms On xarelto    f/U with me  In a year    [name]

## 2016-06-10 ENCOUNTER — Encounter: Payer: Self-pay | Admitting: Cardiovascular Disease

## 2016-06-10 ENCOUNTER — Ambulatory Visit (INDEPENDENT_AMBULATORY_CARE_PROVIDER_SITE_OTHER): Payer: 59 | Admitting: Cardiovascular Disease

## 2016-06-10 VITALS — BP 126/60 | HR 72 | Ht 72.0 in | Wt 222.4 lb

## 2016-06-10 DIAGNOSIS — I481 Persistent atrial fibrillation: Secondary | ICD-10-CM | POA: Diagnosis not present

## 2016-06-10 DIAGNOSIS — I4819 Other persistent atrial fibrillation: Secondary | ICD-10-CM

## 2016-06-10 NOTE — Patient Instructions (Signed)

## 2016-06-11 DIAGNOSIS — R972 Elevated prostate specific antigen [PSA]: Secondary | ICD-10-CM | POA: Diagnosis not present

## 2016-06-12 ENCOUNTER — Encounter: Payer: Self-pay | Admitting: Gastroenterology

## 2016-07-14 ENCOUNTER — Ambulatory Visit (INDEPENDENT_AMBULATORY_CARE_PROVIDER_SITE_OTHER): Payer: 59 | Admitting: Gastroenterology

## 2016-07-14 ENCOUNTER — Encounter: Payer: Self-pay | Admitting: Gastroenterology

## 2016-07-14 ENCOUNTER — Other Ambulatory Visit: Payer: Self-pay | Admitting: *Deleted

## 2016-07-14 VITALS — BP 110/66 | HR 68 | Ht 71.0 in | Wt 224.0 lb

## 2016-07-14 DIAGNOSIS — K625 Hemorrhage of anus and rectum: Secondary | ICD-10-CM

## 2016-07-14 MED ORDER — RIVAROXABAN 20 MG PO TABS: 20.0000 mg | ORAL_TABLET | Freq: Every day | ORAL | 6 refills | Status: DC

## 2016-07-14 MED FILL — XARELTO 20 MG TABLET: 20 | 30 days supply | Qty: 30 | Fill #0

## 2016-07-14 NOTE — Progress Notes (Signed)
HPI: This is a very pleasant  who was referred to me by Gaynelle Arabian, MD  to evaluate  rectal bleeding .    Chief complaint is rectal bleeding  He's had two colonoscopies in past.  Most recent with Dr. Anibal Henderson. He believes hemorrhoids might have been seen. He is not exactly sure on one that colonoscopy was. He thinks he may have had one since then even.  The rectal bleeding occurred with bowel movements for about 5-7 days. It has since stopped as of a week or 2 ago. He had no harder than usual stools. He did not have any anal discomfort or anal itching around that time. He otherwise feels completely fine.    Old Data Reviewed: Previous colonoscopies were not available at the time of this visit    Review of systems: Pertinent positive and negative review of systems were noted in the above HPI section. All other review negative.   Past Medical History:  Diagnosis Date  . Achilles bursitis or tendinitis   . ACHILLES TENDON TEAR   . ANKLE PAIN, RIGHT   . Complication of anesthesia    no surgery  . HEEL PAIN, RIGHT   . HYPERTENSION   . PAROXYSMAL ATRIAL FIBRILLATION   . SINUS TARSI SYNDROME, RIGHT FOOT     Past Surgical History:  Procedure Laterality Date  . CARDIOVERSION N/A 08/15/2015   Procedure: CARDIOVERSION;  Surgeon: Josue Hector, MD;  Location: [facility] ENDOSCOPY;  Service: Cardiovascular;  Laterality: N/A;  . MASS EXCISION Right 10/22/2015   Procedure: EXCISION MASS RIGHT THUMB;  Surgeon: Daryll Brod, MD;  Location: [facility];  Service: Orthopedics;  Laterality: Right;  . TONSILLECTOMY      Current Outpatient Prescriptions  Medication Sig Dispense Refill  . amLODipine (NORVASC) 10 MG tablet Take 10 mg by mouth daily.  3  . lisinopril-hydrochlorothiazide (PRINZIDE,ZESTORETIC) 20-25 MG tablet Take 1 tablet by mouth daily. 90 tablet 3  . rivaroxaban (XARELTO) 20 MG TABS tablet Take 1 tablet (20 mg total) by mouth daily with supper. 30 tablet 11   No  current facility-administered medications for this visit.     Allergies as of 07/14/2016  . (No Known Allergies)    Family History  Problem Relation Age of Onset  . Heart failure Mother        CHF  . Hypertension Mother   . Kidney disease Mother   . Hypertension Brother   . Breast cancer Maternal Aunt   . Lung cancer Maternal Aunt     Social History   Social History  . Marital status: Married    Spouse name: youland  . Number of children: 2  . Years of education: college   Occupational History  . retired    Social History Main Topics  . Smoking status: Never Smoker  . Smokeless tobacco: Never Used  . Alcohol use Yes     Comment: social  . Drug use: No  . Sexual activity: Not on file   Other Topics Concern  . Not on file   Social History Narrative  . No narrative on file     Physical Exam: BP 110/66 (BP Location: Left Arm, Patient Position: Sitting, Cuff Size: Normal)   Pulse 68 Comment: irregular  Ht 5\' 11"  (1.803 m) Comment: height measured withot shoes  Wt 224 lb (101.6 kg)   BMI 31.24 kg/m  Constitutional: generally well-appearing Psychiatric: alert and oriented x3 Eyes: extraocular movements intact Mouth: oral pharynx moist, no lesions  Neck: supple no lymphadenopathy Cardiovascular: heart regular rate and rhythm Lungs: clear to auscultation bilaterally Abdomen: soft, nontender, nondistended, no obvious ascites, no peritoneal signs, normal bowel sounds Extremities: no lower extremity edema bilaterally Skin: no lesions on visible extremities Rectal examination: No external anal hemorrhoids, no anal fissures, there was no suggestion of distal rectal masses or internal hemorrhoids on digital rectal exam. Stool was brown. Not checked for Hemoccult.  Assessment and plan: 66 y.o. male with  minor rectal bleeding  We are going to get records sent from his previous gastroenterologist. If he has had not had colonoscopy within the past 3 years or so then I  recommended repeating it for him now to exclude neoplastic causes of minor rectal bleeding. I think that is unlikely he has anything serious going on. Much more likely is small internal hemorrhoids that since resolved.  He is on xarelto and so if he needs repeat colonoscopy that will need to be held for 2 days if OK with his cardiologist.  Please see the "Patient Instructions" section for addition details about the plan.   Owens Loffler, MD [facility] 07/14/2016, 10:39 AM  Cc: Gaynelle Arabian, MD

## 2016-07-14 NOTE — Patient Instructions (Addendum)
We will get records sent from your previous gastroenterologist [facility] Dr. Sammuel Cooper)  for review.  This will include any endoscopic (colonoscopy or upper endoscopy) procedures and any associated pathology reports.

## 2016-07-14 NOTE — Telephone Encounter (Signed)
Request received for Xarelto 20mg ; pt is 66 yrs old, wt-101.6kg, Cr-1.03 on 08/09/15, last seen by Dr. Johnsie Cancel on 06/10/16, CrCl-101.73ml/min. Pt had labs done with PCP on 03/27/16 & CrCl-104.55ml/min. Will send in refill request to requested pharmacy.

## 2016-07-15 ENCOUNTER — Telehealth: Payer: Self-pay | Admitting: Gastroenterology

## 2016-07-15 NOTE — Telephone Encounter (Signed)
Pt did not have a good signal and will call back at a better time. Letter sent to the cardiologist

## 2016-07-15 NOTE — Telephone Encounter (Signed)
Josue Hector, MD sent to Jeoffrey Massed, RN        Ok to hold anticoagulation and have procedure   Previous Messages    ----- Message -----  From: Jeoffrey Massed, RN  Sent: 07/15/2016 10:12 AM  To: Josue Hector, MD

## 2016-07-15 NOTE — Telephone Encounter (Signed)
The pt has been notified previsit and colon scheduled, xarelto hold ok'd

## 2016-07-15 NOTE — Telephone Encounter (Signed)
Colonoscopy Dr. Sammuel Cooper 05/2008 for heme + stool: excellent prep, normal examination except for internal and external hemorrhoids.  He was recommended to have repeat examination in 10 years for screening.   Please call him.  Looks like 2010 was the most recent colonoscopy and since he's having new rectal bleeding we should repeat colonoscopy for him now.  Will need blood thinner (xarelto) held for 2 days prior, need authorization by his cardiologist.  Thanks  Radonna Ricker

## 2016-07-28 ENCOUNTER — Encounter: Payer: Self-pay | Admitting: Gastroenterology

## 2016-07-28 ENCOUNTER — Ambulatory Visit (AMBULATORY_SURGERY_CENTER): Payer: Self-pay

## 2016-07-28 VITALS — Ht 72.0 in | Wt 221.4 lb

## 2016-07-28 DIAGNOSIS — K625 Hemorrhage of anus and rectum: Secondary | ICD-10-CM

## 2016-07-28 MED ORDER — NA SULFATE-K SULFATE-MG SULF 17.5-3.13-1.6 GM/177ML PO SOLN: ORAL | 0 refills | Status: DC

## 2016-07-28 MED FILL — SUPREP BOWEL PREP KIT: 17.5-3.13-1 | 2 days supply | Qty: 354 | Fill #0

## 2016-07-28 NOTE — Progress Notes (Signed)
Per pt, no allergies to soy or egg products.Pt not taking any weight loss meds or using  O2 at home.   Pt refused Emmi video. 

## 2016-08-04 ENCOUNTER — Ambulatory Visit (AMBULATORY_SURGERY_CENTER): Payer: 59 | Admitting: Gastroenterology

## 2016-08-04 ENCOUNTER — Encounter: Payer: 59 | Admitting: Gastroenterology

## 2016-08-04 ENCOUNTER — Encounter: Payer: Self-pay | Admitting: Gastroenterology

## 2016-08-04 VITALS — BP 138/80 | HR 56 | Temp 97.5°F | Resp 15 | Ht 71.0 in | Wt 224.0 lb

## 2016-08-04 DIAGNOSIS — D124 Benign neoplasm of descending colon: Secondary | ICD-10-CM

## 2016-08-04 DIAGNOSIS — D123 Benign neoplasm of transverse colon: Secondary | ICD-10-CM

## 2016-08-04 DIAGNOSIS — K649 Unspecified hemorrhoids: Secondary | ICD-10-CM | POA: Diagnosis not present

## 2016-08-04 DIAGNOSIS — D122 Benign neoplasm of ascending colon: Secondary | ICD-10-CM | POA: Diagnosis not present

## 2016-08-04 DIAGNOSIS — K625 Hemorrhage of anus and rectum: Secondary | ICD-10-CM | POA: Diagnosis present

## 2016-08-04 MED ORDER — SODIUM CHLORIDE 0.9 % IV SOLN: 500.0000 mL | INTRAVENOUS | Status: AC

## 2016-08-04 NOTE — Patient Instructions (Signed)
Discharge instructions given. Handouts on polyps and hemorrhoids. Resume previous medications. YOU HAD AN ENDOSCOPIC PROCEDURE TODAY AT THE [facility]:   Refer to the procedure report that was given to you for any specific questions about what was found during the examination.  If the procedure report does not answer your questions, please call your gastroenterologist to clarify.  If you requested that your care partner not be given the details of your procedure findings, then the procedure report has been included in a sealed envelope for you to review at your convenience later.  YOU SHOULD EXPECT: Some feelings of bloating in the abdomen. Passage of more gas than usual.  Walking can help get rid of the air that was put into your GI tract during the procedure and reduce the bloating. If you had a lower endoscopy (such as a colonoscopy or flexible sigmoidoscopy) you may notice spotting of blood in your stool or on the toilet paper. If you underwent a bowel prep for your procedure, you may not have a normal bowel movement for a few days.  Please Note:  You might notice some irritation and congestion in your nose or some drainage.  This is from the oxygen used during your procedure.  There is no need for concern and it should clear up in a day or so.  SYMPTOMS TO REPORT IMMEDIATELY:   Following lower endoscopy (colonoscopy or flexible sigmoidoscopy):  Excessive amounts of blood in the stool  Significant tenderness or worsening of abdominal pains  Swelling of the abdomen that is new, acute  Fever of 100F or higher   For urgent or emergent issues, a gastroenterologist can be reached at any hour by calling [phone].   DIET:  We do recommend a small meal at first, but then you may proceed to your regular diet.  Drink plenty of fluids but you should avoid alcoholic beverages for 24 hours.  ACTIVITY:  You should plan to take it easy for the rest of today and you should NOT DRIVE  or use heavy machinery until tomorrow (because of the sedation medicines used during the test).    FOLLOW UP: Our staff will call the number listed on your records the next business day following your procedure to check on you and address any questions or concerns that you may have regarding the information given to you following your procedure. If we do not reach you, we will leave a message.  However, if you are feeling well and you are not experiencing any problems, there is no need to return our call.  We will assume that you have returned to your regular daily activities without incident.  If any biopsies were taken you will be contacted by phone or by letter within the next 1-3 weeks.  Please call us at [phone] if you have not heard about the biopsies in 3 weeks.    SIGNATURES/CONFIDENTIALITY: You and/or your care partner have signed paperwork which will be entered into your electronic medical record.  These signatures attest to the fact that that the information above on your After Visit Summary has been reviewed and is understood.  Full responsibility of the confidentiality of this discharge information lies with you and/or your care-partner. 

## 2016-08-04 NOTE — Progress Notes (Signed)
Called to room to assist during endoscopic procedure.  Patient ID and intended procedure confirmed with present staff. Received instructions for my participation in the procedure from the performing physician.  

## 2016-08-04 NOTE — Progress Notes (Signed)
Pt's states no medical or surgical changes since previsit or office visit. 

## 2016-08-04 NOTE — Op Note (Signed)
[facility] Patient Name: Leonard Campbell Procedure Date: 08/04/2016 8:41 AM MRN: [id number] Endoscopist: Milus Banister , MD Age: 66 Referring MD:  Date of Birth: [birth year] Gender: Male Account #: [id number] Procedure:                Colonoscopy Indications:              Rectal bleeding Medicines:                Monitored Anesthesia Care Procedure:                Pre-Anesthesia Assessment:                           - Prior to the procedure, a History and Physical                            was performed, and patient medications and                            allergies were reviewed. The patient's tolerance of                            previous anesthesia was also reviewed. The risks                            and benefits of the procedure and the sedation                            options and risks were discussed with the patient.                            All questions were answered, and informed consent                            was obtained. Prior Anticoagulants: The patient has                            taken Xarelto (rivaroxaban), last dose was 2 days                            prior to procedure. ASA Grade Assessment: III - A                            patient with severe systemic disease. After                            reviewing the risks and benefits, the patient was                            deemed in satisfactory condition to undergo the                            procedure.  After obtaining informed consent, the colonoscope                            was passed under direct vision. Throughout the                            procedure, the patient's blood pressure, pulse, and                            oxygen saturations were monitored continuously. The                            Colonoscope was introduced through the anus and                            advanced to the the cecum, identified by   appendiceal orifice and ileocecal valve. The                            colonoscopy was performed without difficulty. The                            patient tolerated the procedure well. The quality                            of the bowel preparation was excellent. The                            ileocecal valve, appendiceal orifice, and rectum                            were photographed. Scope In: 8:43:13 AM Scope Out: 8:57:08 AM Scope Withdrawal Time: 0 hours 11 minutes 40 seconds  Total Procedure Duration: 0 hours 13 minutes 55 seconds  Findings:                 Four sessile polyps were found in the descending                            colon, transverse colon and ascending colon. The                            polyps were 2 to 4 mm in size. These polyps were                            removed with a cold snare. Resection and retrieval                            were complete.                           Internal hemorrhoids were found. The hemorrhoids                            were small.  The exam was otherwise without abnormality on                            direct and retroflexion views. Complications:            No immediate complications. Estimated blood loss:                            None. Estimated Blood Loss:     Estimated blood loss: none. Impression:               - Four 2 to 4 mm polyps in the descending colon, in                            the transverse colon and in the ascending colon,                            removed with a cold snare. Resected and retrieved.                           - Internal hemorrhoids. These are likely the source                            of your mild, intermittent rectal bleeding (no                            bleeding in about a month per patient)                           - The examination was otherwise normal on direct                            and retroflexion views. Recommendation:           - Patient has a  contact number available for                            emergencies. The signs and symptoms of potential                            delayed complications were discussed with the                            patient. Return to normal activities tomorrow.                            Written discharge instructions were provided to the                            patient.                           - Resume previous diet.                           - Continue present medications.  OK to resume                            xarelto today.                           You will receive a letter within 2-3 weeks with the                            pathology results and my final recommendations.                           If the polyp(s) is proven to be 'pre-cancerous' on                            pathology, you will need repeat colonoscopy in 3                            years. If the polyp(s) is NOT 'precancerous' on                            pathology then you should repeat colon cancer                            screening in 10 years with colonoscopy without need                            for colon cancer screening by any method prior to                            then (including stool testing). Milus Banister, MD 08/04/2016 9:06:30 AM This report has been signed electronically.

## 2016-08-04 NOTE — Progress Notes (Signed)
Spontaneous respirations throughout. VSS. Resting comfortably. To PACU on room air. Report to  Celia RN. 

## 2016-08-05 ENCOUNTER — Telehealth: Payer: Self-pay | Admitting: *Deleted

## 2016-08-05 NOTE — Telephone Encounter (Signed)
  Follow up Call-  Call back number 08/04/2016  Post procedure Call Back phone  # [phone]  Permission to leave phone message Yes  Some recent data might be hidden     Patient questions:  Do you have a fever, pain , or abdominal swelling? No. Pain Score  0 *  Have you tolerated food without any problems? Yes.    Have you been able to return to your normal activities? Yes.    Do you have any questions about your discharge instructions: Diet   No. Medications  No. Follow up visit  No.  Do you have questions or concerns about your Care? No.  Actions: * If pain score is 4 or above: No action needed, pain <4.

## 2016-08-10 MED FILL — AMLODIPINE BESYLATE 10 MG T: 10 | 90 days supply | Qty: 90 | Fill #1

## 2016-08-10 MED FILL — XARELTO 20 MG TABLET: 20 | 30 days supply | Qty: 30 | Fill #1

## 2016-08-16 ENCOUNTER — Encounter: Payer: Self-pay | Admitting: Gastroenterology

## 2016-08-28 MED FILL — XARELTO 20 MG TABLET: 20 | 30 days supply | Qty: 30 | Fill #2

## 2016-08-28 MED FILL — LISINOPRIL-HCTZ 20-25 MG TA: 20-25 | 90 days supply | Qty: 90 | Fill #0

## 2016-09-01 ENCOUNTER — Encounter: Payer: 59 | Admitting: Gastroenterology

## 2016-09-25 MED FILL — NAPROXEN SODIUM 550 MG TAB: 550 | 4 days supply | Qty: 12 | Fill #0

## 2016-09-25 MED FILL — AMOXICILLIN 875 MG TABLET: 875 | 10 days supply | Qty: 20 | Fill #0

## 2016-10-05 MED FILL — CHLORHEXIDINE 0.12% RINSE: 0.12 | 16 days supply | Qty: 473 | Fill #0

## 2016-10-12 MED FILL — XARELTO 20 MG TABLET: 20 | 30 days supply | Qty: 30 | Fill #3

## 2016-10-21 MED FILL — AMLODIPINE BESYLATE 10 MG T: 10 | 90 days supply | Qty: 90 | Fill #2

## 2016-11-09 MED FILL — XARELTO 20 MG TABLET: 20 | 30 days supply | Qty: 30 | Fill #4

## 2016-12-03 DIAGNOSIS — R972 Elevated prostate specific antigen [PSA]: Secondary | ICD-10-CM | POA: Diagnosis not present

## 2016-12-07 MED FILL — LISINOPRIL-HCTZ 20-25 MG TA: 20-25 | 90 days supply | Qty: 90 | Fill #1

## 2016-12-07 MED FILL — XARELTO 20 MG TABLET: 20 | 30 days supply | Qty: 30 | Fill #5

## 2017-01-04 MED FILL — XARELTO 20 MG TABLET: 20 | 30 days supply | Qty: 30 | Fill #6

## 2017-01-14 DIAGNOSIS — R972 Elevated prostate specific antigen [PSA]: Secondary | ICD-10-CM | POA: Diagnosis not present

## 2017-01-14 MED FILL — levoFLOXacin 750 MG TABS: 750 | 1 days supply | Qty: 1 | Fill #0

## 2017-01-15 ENCOUNTER — Telehealth: Payer: Self-pay

## 2017-01-15 NOTE — Telephone Encounter (Signed)
   [facility] Pre-operative Risk Assessment    Request for surgical clearance:  1. What type of surgery is being performed? Prostate ultrasound and biopsy   2. When is this surgery scheduled? Not noted  3. Are there any medications that need to be held prior to surgery and how long? Xarelto 5 days prior to the biopsy   4. Practice name and name of physician performing surgery? [facility]   5. What is your office phone and fax number? [phone] Fax:[phone]  6. Anesthesia type (None, local, MAC, general) ? None    Mendel Ryder 01/15/2017, 2:20 PM  _________________________________________________________________   (provider comments below)

## 2017-01-18 NOTE — Telephone Encounter (Signed)
   Primary Cardiologist: No primary care provider on file.  Chart reviewed as part of pre-operative protocol coverage.  Left voice mail to call back.   Reviewed with Dr. Johnsie Cancel personally--> he will be cleared for surgery if able to archive graether than 4 METS of activity. Ok to hold Xarelto 2-3 days prior to surgery. Usually warfarin needs to hold for 5 days not Xaralto.    [place], [place] 01/18/2017, 3:38 PM

## 2017-01-19 NOTE — Telephone Encounter (Signed)
   Primary Cardiologist: [name], MD  Chart reviewed as part of pre-operative protocol coverage. Patient was contacted 01/19/2017 in reference to pre-operative risk assessment for pending surgery as outlined below.  WELLES WALTHALL was last seen on Dr.Nishan by 05/2016.  Since that day, ANDRW MCGUIRT has done well. RCRI calculated at 0.4%. [facility] activity status index calculated at 50.7 points, achieving 8.97 METS without angina.  Therefore, based on ACC/AHA guidelines, the patient would be at acceptable risk for the planned procedure without further cardiovascular testing.   The initial pre-op request asked to hold Xarelto for 5 days prior to procedure. However, given nature of action of Xarelto, holding for this long is not usually felt necessary. Dr. Johnsie Cancel feels he can hold Xarelto 2-3 days prior to surgery.  I will route this recommendation to the requesting party via Epic fax function and remove from pre-op pool.  Please call with questions.  Charlie Pitter, PA-C 01/19/2017, 2:27 PM

## 2017-01-22 ENCOUNTER — Telehealth: Payer: Self-pay

## 2017-01-22 NOTE — Telephone Encounter (Signed)
   [facility] Pre-operative Risk Assessment    Request for surgical clearance:  1. What type of surgery is being performed? Periodontal Implant Placement   2. When is this surgery scheduled? TBD   3. Are there any medications that need to be held prior to surgery and how long? Xarelto - Please advise on when to stop and resume   4. Practice name and name of physician performing surgery? [facility]   5. What is your office phone and fax number? (P) [phone] (F) [phone]   6. Anesthesia type (None, local, MAC, general) ? Not mentioned   Bobby Rumpf 01/22/2017, 11:15 AM  _________________________________________________________________   (provider comments below)

## 2017-01-23 NOTE — Telephone Encounter (Signed)
   Chart reviewed as part of pre-operative protocol coverage.   Callback staff, please clarify type of anesthesia, and to clarify if this is request for clearance or just question about blood thinner.  Will route to pharm D about Xarelto in interim.   Charlie Pitter, PA-C 01/23/2017, 3:47 PM

## 2017-01-25 NOTE — Telephone Encounter (Signed)
Patient with diagnosis of Atrial fibrillation on Xarelto for anticoagulation.    Procedure: Periodontal Implant Date of procedure: TBD  CHADS2-VASc score of  2 ( HTN, AGE (65-74))  *No recent cardioversion dose. No history of  VTE/stroke/TIA noted during chart review*  CrCl  > 50 ml/min (Scr = 1.0)  Per office protocol, patient can hold xarelto for 24 hours prior to procedure.  Patient should restart Xarelto on the evening of procedure or day after, at discretion of procedure MD  Leonard Campbell PharmD, BCPS, [place] [address] [place] [address] 01/25/2017 11:58 AM

## 2017-01-27 NOTE — Telephone Encounter (Signed)
   Primary Cardiologist: [name], MD  Chart reviewed as part of pre-operative protocol coverage. Reached out to the dental office and only local anesthetic will be used. Anticoagulation has been reviewed by Pharmacy and he can hold Xarelto for 24 hours prior to the procedure.  I will route this recommendation to the requesting party via Epic fax function and remove from pre-op pool.  Please call with questions.  Erma Heritage, PA-C 01/27/2017, 3:38 PM

## 2017-02-01 ENCOUNTER — Other Ambulatory Visit: Payer: Self-pay | Admitting: Cardiovascular Disease

## 2017-02-01 ENCOUNTER — Telehealth: Payer: Self-pay | Admitting: Cardiovascular Disease

## 2017-02-01 MED FILL — XARELTO 20 MG TABLET: 20 | 30 days supply | Qty: 30 | Fill #0

## 2017-02-01 NOTE — Telephone Encounter (Signed)
Left Glenard Haring a message letting her know that cardiac clearance has been faxed over to their office on 01/19/17 and if she needed further information, that she could call us back.

## 2017-02-01 NOTE — Telephone Encounter (Signed)
Xarelto 20mg  refill request received; pt is 66 yrs old, wt-101.6kg, Crea-1.00 on 03/27/16 per faxed labs into the chart by [facility] MD, CrCl-104.30ml/min, last seen by Dr. Johnsie Cancel on 06/10/16; will send in refill request to requested pharmacy.

## 2017-02-01 NOTE — Telephone Encounter (Signed)
Resending clearance per Glenard Haring.

## 2017-02-01 NOTE — Telephone Encounter (Signed)
F/u Message  Glenard Haring from [facility] Dr. Alyson Ingles office call to f/u on surgical clearance. Please call back to discuss

## 2017-02-05 MED FILL — diazePAM 10 MG TABS: 10 | 1 days supply | Qty: 1 | Fill #0

## 2017-02-11 MED FILL — AMLODIPINE BESYLATE 10 MG T: 10 | 90 days supply | Qty: 90 | Fill #3

## 2017-02-12 DIAGNOSIS — R972 Elevated prostate specific antigen [PSA]: Secondary | ICD-10-CM | POA: Diagnosis not present

## 2017-02-18 ENCOUNTER — Telehealth: Payer: Self-pay

## 2017-02-18 NOTE — Telephone Encounter (Signed)
   [facility] Pre-operative Risk Assessment    Request for surgical clearance:  1. What type of surgery is being performed? periodontal implant placement  2. When is this surgery scheduled? TBD   3. Are there any medications that need to be held prior to surgery and how long? please advise as to how to manage his xarelto for implant placement. When to cease and when to restart.  4. Practice name and name of physician performing surgery? [facility]  5. What is your office phone and fax number? Phone: [phone]. Fax: [phone].   6. Anesthesia type (None, local, MAC, general) ? Not specified.   Stephannie Peters 02/18/2017, 4:43 PM  _________________________________________________________________   (provider comments below)

## 2017-02-19 DIAGNOSIS — R972 Elevated prostate specific antigen [PSA]: Secondary | ICD-10-CM | POA: Diagnosis not present

## 2017-02-19 NOTE — Telephone Encounter (Signed)
   Primary Cardiologist: [name], MD  Chart reviewed as part of pre-operative protocol coverage. The patient was already cleared by Dayna Dunn 01/19/17 "RCRI calculated at 0.4%. [facility] activity status index calculated at 50.7 points, achieving 8.97 METS without angina. Therefore, based on ACC/AHA guidelines, the patient would be at acceptable risk for the planned procedure without further cardiovascular testing".   Please make sure that [facility] office has received our recommendations.    [place], [place] 02/19/2017, 2:04 PM

## 2017-02-19 NOTE — Telephone Encounter (Signed)
Per protocol, faxed clearance over to Dr. Chelsea Aus, DDS office.

## 2017-02-19 NOTE — Telephone Encounter (Signed)
Pt takes Xarelto for afib for CHADS2VASc score of 2 (HTN, age). Ok to hold for 1 day prior to implant placement and resume within 24 hours after if possible.

## 2017-03-02 MED FILL — LISINOPRIL-HCTZ 20-25 MG TA: 20-25 | 90 days supply | Qty: 90 | Fill #2

## 2017-03-02 MED FILL — XARELTO 20 MG TABLET: 20 | 30 days supply | Qty: 30 | Fill #1

## 2017-04-08 MED FILL — XARELTO 20 MG TABLET: 20 | 30 days supply | Qty: 30 | Fill #2

## 2017-04-09 DIAGNOSIS — M25571 Pain in right ankle and joints of right foot: Secondary | ICD-10-CM | POA: Diagnosis not present

## 2017-05-04 DIAGNOSIS — Z0000 Encounter for general adult medical examination without abnormal findings: Secondary | ICD-10-CM | POA: Diagnosis not present

## 2017-05-04 DIAGNOSIS — I10 Essential (primary) hypertension: Secondary | ICD-10-CM | POA: Diagnosis not present

## 2017-05-04 DIAGNOSIS — N529 Male erectile dysfunction, unspecified: Secondary | ICD-10-CM | POA: Diagnosis not present

## 2017-05-04 DIAGNOSIS — I4891 Unspecified atrial fibrillation: Secondary | ICD-10-CM | POA: Diagnosis not present

## 2017-05-04 DIAGNOSIS — Z125 Encounter for screening for malignant neoplasm of prostate: Secondary | ICD-10-CM | POA: Diagnosis not present

## 2017-05-04 DIAGNOSIS — R809 Proteinuria, unspecified: Secondary | ICD-10-CM | POA: Diagnosis not present

## 2017-05-06 MED FILL — XARELTO 20 MG TABLET: 20 | 30 days supply | Qty: 30 | Fill #3

## 2017-05-06 MED FILL — AMLODIPINE BESYLATE 10 MG T: 10 | 90 days supply | Qty: 90 | Fill #0

## 2017-05-07 DIAGNOSIS — M25571 Pain in right ankle and joints of right foot: Secondary | ICD-10-CM | POA: Diagnosis not present

## 2017-05-21 ENCOUNTER — Ambulatory Visit: Payer: 59 | Attending: Orthopaedic Surgery | Admitting: Physical Therapy

## 2017-05-21 ENCOUNTER — Encounter: Payer: Self-pay | Admitting: Physical Therapy

## 2017-05-21 DIAGNOSIS — M79661 Pain in right lower leg: Secondary | ICD-10-CM | POA: Insufficient documentation

## 2017-05-21 DIAGNOSIS — M79671 Pain in right foot: Secondary | ICD-10-CM | POA: Insufficient documentation

## 2017-05-21 NOTE — Therapy (Signed)
[place], [place], [address] Phone: [phone]   Fax:  [phone]  Physical Therapy Evaluation  Patient Details  Name: Leonard Campbell MRN: [id number] Date of Birth: 01-Feb-1951 Referring Provider: Dr. Rhona Raider    Encounter Date: 05/21/2017  PT End of Session - 05/21/17 1012    Visit Number  1    Number of Visits  5    Date for PT Re-Evaluation  06/25/17    PT Start Time  8315 pt late     PT Stop Time  1016    PT Time Calculation (min)  38 min    Activity Tolerance  Patient tolerated treatment well    Behavior During Therapy  [facility] for tasks assessed/performed       Past Medical History:  Diagnosis Date  . Achilles bursitis or tendinitis   . ACHILLES TENDON TEAR   . ANKLE PAIN, RIGHT   . Complication of anesthesia    None per pt!  Marland Kitchen HEEL PAIN, RIGHT   . Hemorrhoids   . HYPERTENSION   . PAROXYSMAL ATRIAL FIBRILLATION   . SINUS TARSI SYNDROME, RIGHT FOOT     Past Surgical History:  Procedure Laterality Date  . CARDIOVERSION N/A 08/15/2015   Procedure: CARDIOVERSION;  Surgeon: Josue Hector, MD;  Location: [facility] ENDOSCOPY;  Service: Cardiovascular;  Laterality: N/A;  . MASS EXCISION Right 10/22/2015   Procedure: EXCISION MASS RIGHT THUMB;  Surgeon: Daryll Brod, MD;  Location: [facility];  Service: Orthopedics;  Laterality: Right;  . TONSILLECTOMY      There were no vitals filed for this visit.   Subjective Assessment - 05/21/17 0940    Subjective  Patient presents with Rt. sided insertional achilles pain which has been ongoing for 2 yrs.  He reports pain is not worsening.  This causes pain and stiffness if he does too much walking (after the walk) or if he sits too long and goes to stand up.  He walks on the TM for 30-60 min everyday.  He is an active bowler and wants to be able to participate at the level he is used to.      Pertinent History  4-5 yrs ago tore Achilles on Rt. side  , sinus tarsi Rt. foot.     Limitations  Sitting;Walking    How long can you sit comfortably?  stiffness if he sits too long     How long can you walk comfortably?  walking is oK but pain post     Diagnostic tests  bone spur Rt. insertion of Achilles     Patient Stated Goals  Patient would like to avoid surgery.     Currently in Pain?  No/denies    Pain Score  2  min stiffness     Pain Location  Ankle    Pain Orientation  Right    Pain Descriptors / Indicators  Tightness    Pain Type  Chronic pain    Pain Onset  More than a month ago    Pain Frequency  Intermittent    Aggravating Factors   jumping, running, overactivity or if he sits to long in one place     Pain Relieving Factors  gentle activity    Effect of Pain on Daily Activities  min discomfort at times     Multiple Pain Sites  No         [facility] PT Assessment - 05/21/17 0001  Assessment   Medical Diagnosis  Achilles insertional pain     Referring Provider  Dr. Rhona Raider     Onset Date/Surgical Date  -- chronic     Prior Therapy  no       Precautions   Precautions  None      Restrictions   Weight Bearing Restrictions  No      Balance Screen   Has the patient fallen in the past 6 months  No      [place] residence    Living Arrangements  Spouse/significant other;Children    Type of [place] Access  Level entry    Home Layout  Two level    Alternate Level Stairs-Number of Steps  12    Alternate Level Stairs-Rails  Right      Prior Function   Level of Independence  Independent    Vocation  Retired    [employer]    Leisure   active, Environmental consultant, fitness, travel       Cognition   Overall Cognitive Status  Within Functional Limits for tasks assessed      Observation/Other Assessments   Observations  edema present in post Rt. Achillles viewed from above patient in prone     Focus on Therapeutic Outcomes (FOTO)   38%      Sensation   Light Touch   Appears Intact      Posture/Postural Control   Posture/Postural Control  No significant limitations    Posture Comments  has heel cups bilateral, has arch in standing       AROM   Right/Left Ankle  -- inv, ev WNL     Right Ankle Dorsiflexion  10    Right Ankle Plantar Flexion  58    Left Ankle Dorsiflexion  12    Left Ankle Plantar Flexion  60      PROM   Overall PROM Comments  WNL       Strength   Overall Strength Comments  WFL , can do Rt Single leg heel raise x 15 with hand support, though decreased ROM       Palpation   Palpation comment  very tender small area at Achilles insertion , none in distal calf or heel       Special Tests   Other special tests  SLS 30 sec bilaterally       Ambulation/Gait   Gait Comments  no apparent deviation                 Objective measurements completed on examination: See above findings.      [facility] Adult PT Treatment/Exercise - 05/21/17 0001      Iontophoresis   Type of Iontophoresis  Dexamethasone    Location  Rt achilles insert.     Dose  1 cc     Time  6 hr       Ankle Exercises: Stretches   Plantar Fascia Stretch  30 seconds    Soleus Stretch  2 reps;30 seconds    Gastroc Stretch  2 reps;30 seconds    Other Stretch  multiple ways: step, towel and wall              PT Education - 05/21/17 1017    Education provided  Yes    Education Details  PT/POC, RICE, ionto, HEP     Person(s) Educated  Patient    Methods  Explanation    Comprehension  Verbalized understanding          PT Long Term Goals - 05/21/17 1039      PT LONG TERM GOAL #1   Title  Pt will I with HEP for ankle, LE strength and flexibilty     Time  4    Period  Weeks    Status  New    Target Date  06/25/17      PT LONG TERM GOAL #2   Title  Pt will be able to report 25% less stiffness in ankle, pain after sitting for meals or in AM.     Time  4    Period  Weeks    Status  New    Target Date  06/25/17      PT LONG TERM GOAL #3    Title  FOTO score will improve to <28% impaired to demo improvement in functional mobility.     Time  4    Period  Weeks    Status  New    Target Date  06/25/17      PT LONG TERM GOAL #4   Title  Pt will be able to perfom 5 quality  heel raises on  Rt. LE no UE support to demo increased balance, strength and power.     Time  4    Period  Weeks    Status  New    Target Date  06/25/17             Plan - 05/21/17 1042    Clinical Impression Statement  Pt presents for low complexity eval of Rt. insertional Achilles pain.  He is very active but has not consistently stretched after activity.  He reports a bone spur but that was not visible in our records.  This likely developed as a result of his injury about 5 yrs ago.  He has functional ROM and strength but with greater flexibility in ankle, foot and knee he may find some relief of the pain caused by tissue tension surrounding said bone spur.      Clinical Presentation  Stable    Clinical Decision Making  Low    Rehab Potential  Excellent    PT Frequency  1x / week    PT Duration  6 weeks allow 5 weeks for scheduling     PT Treatment/Interventions  Taping;Iontophoresis 4mg /ml Dexamethasone;Moist Heat;Cryotherapy;Ultrasound;Therapeutic activities;Patient/family education;Neuromuscular re-education;Therapeutic exercise;Manual techniques;Passive range of motion    PT Next Visit Plan  check stretching, add eccentric loading, ionto vs tape, [place]     PT Home Exercise Plan  gastroc/soleus stretch     Consulted and Agree with Plan of Care  Patient       Patient will benefit from skilled therapeutic intervention in order to improve the following deficits and impairments:  Pain, Increased fascial restricitons, Decreased strength, Decreased range of motion, Increased edema  Visit Diagnosis: Pain in right lower leg  Pain of right heel     Problem List Patient Active Problem List   Diagnosis Date Noted  . ANKLE PAIN, RIGHT 02/18/2010   . SINUS TARSI SYNDROME, RIGHT FOOT 02/18/2010  . HYPERTENSION 05/09/2008  . PAROXYSMAL ATRIAL FIBRILLATION 05/09/2008  . ACHILLES BURSITIS OR TENDINITIS 12/02/2007  . HEEL PAIN, RIGHT 12/02/2007  . ACHILLES TENDON TEAR 12/02/2007    Janele Lague 05/21/2017, 10:50 AM  [facility] [address] [place], [place], [address] Phone: [phone]   Fax:  [phone]  Name: Leonard Campbell MRN: [id number] Date of Birth: [birth year]   Raeford Razor, PT 05/21/17 10:50 AM Phone: [phone] Fax: [phone]

## 2017-05-21 NOTE — Patient Instructions (Signed)

## 2017-06-03 ENCOUNTER — Encounter: Payer: Self-pay | Admitting: Physical Therapy

## 2017-06-03 ENCOUNTER — Ambulatory Visit: Payer: 59 | Admitting: Physical Therapy

## 2017-06-03 DIAGNOSIS — M79661 Pain in right lower leg: Secondary | ICD-10-CM | POA: Diagnosis not present

## 2017-06-03 DIAGNOSIS — M79671 Pain in right foot: Secondary | ICD-10-CM | POA: Diagnosis not present

## 2017-06-03 NOTE — Therapy (Addendum)
[place], [place], [address] Phone: [phone]   Fax:  [phone]  Physical Therapy Treatment  Patient Details  Name: Leonard Campbell MRN: [id number] Date of Birth: April 24, 1950 Referring Provider: Dr. Rhona Raider    Encounter Date: 06/03/2017  PT End of Session - 06/03/17 0949    Visit Number  2    Number of Visits  5    Date for PT Re-Evaluation  06/25/17    PT Start Time  0931    PT Stop Time  1012    PT Time Calculation (min)  41 min    Activity Tolerance  Patient tolerated treatment well    Behavior During Therapy  [facility] for tasks assessed/performed       Past Medical History:  Diagnosis Date  . Achilles bursitis or tendinitis   . ACHILLES TENDON TEAR   . ANKLE PAIN, RIGHT   . Complication of anesthesia    None per pt!  Marland Kitchen HEEL PAIN, RIGHT   . Hemorrhoids   . HYPERTENSION   . PAROXYSMAL ATRIAL FIBRILLATION   . SINUS TARSI SYNDROME, RIGHT FOOT     Past Surgical History:  Procedure Laterality Date  . CARDIOVERSION N/A 08/15/2015   Procedure: CARDIOVERSION;  Surgeon: Josue Hector, MD;  Location: [facility] ENDOSCOPY;  Service: Cardiovascular;  Laterality: N/A;  . MASS EXCISION Right 10/22/2015   Procedure: EXCISION MASS RIGHT THUMB;  Surgeon: Daryll Brod, MD;  Location: [facility];  Service: Orthopedics;  Laterality: Right;  . TONSILLECTOMY      There were no vitals filed for this visit.  Subjective Assessment - 06/03/17 0947    Subjective  Patient reports it is feeling a little better. He is not feeling any pain today. He has some tenderness right down the midle of the tendon.      Pertinent History  4-5 yrs ago tore Achilles on Rt. side , sinus tarsi Rt. foot.     Limitations  Sitting;Walking    How long can you sit comfortably?  stiffness if he sits too long     How long can you walk comfortably?  walking is oK but pain post     Diagnostic tests  bone spur Rt. insertion of  Achilles     Patient Stated Goals  Patient would like to avoid surgery.     Currently in Pain?  No/denies                       [facility] Adult PT Treatment/Exercise - 06/03/17 0001      Manual Therapy   Manual therapy comments  IASTM; Gastroc/Soleus     Kinesiotex  Facilitate Muscle      Kinesiotix   Facilitate Muscle   Gastroc; bottom of the calcanous to head of gastroc; perpendicular to tendon       Ankle Exercises: Standing   Other Standing Ankle Exercises  SLS; 3x 10 sec    Other Standing Ankle Exercises  Standing March; 2x20; 5 sec              PT Education - 06/03/17 0949    Education provided  Yes    Education Details  reviewed exercises and how to remove the tape     Person(s) Educated  Patient    Methods  Explanation;Demonstration;Tactile cues;Verbal cues    Comprehension  Verbalized understanding;Returned demonstration;Verbal cues required;Tactile cues required          PT Long  Term Goals - 05/21/17 1039      PT LONG TERM GOAL #1   Title  Pt will I with HEP for ankle, LE strength and flexibilty     Time  4    Period  Weeks    Status  New    Target Date  06/25/17      PT LONG TERM GOAL #2   Title  Pt will be able to report 25% less stiffness in ankle, pain after sitting for meals or in AM.     Time  4    Period  Weeks    Status  New    Target Date  06/25/17      PT LONG TERM GOAL #3   Title  FOTO score will improve to <28% impaired to demo improvement in functional mobility.     Time  4    Period  Weeks    Status  New    Target Date  06/25/17      PT LONG TERM GOAL #4   Title  Pt will be able to perfom 5 quality  heel raises on  Rt. LE no UE support to demo increased balance, strength and power.     Time  4    Period  Weeks    Status  New    Target Date  06/25/17            Plan - 06/03/17 0949    Clinical Impression Statement  Patient tolerated treatment well. Therapy tried kinesio tape to treduce tension on tendon and  increase blood flow. Therapy also used IASTYM to release gastroc and soleus. He had no pain with treatment. Therapy added single leg stance avctivity with upper extremity support.     Clinical Presentation  Stable    Clinical Decision Making  Low    Rehab Potential  Excellent    PT Frequency  1x / week    PT Duration  6 weeks    PT Treatment/Interventions  Taping;Iontophoresis 4mg /ml Dexamethasone;Moist Heat;Cryotherapy;Ultrasound;Therapeutic activities;Patient/family education;Neuromuscular re-education;Therapeutic exercise;Manual techniques;Passive range of motion    PT Next Visit Plan  check stretching, add eccentric loading, ionto vs tape, [place]     PT Home Exercise Plan  gastroc/soleus stretch     Consulted and Agree with Plan of Care  Patient       Patient will benefit from skilled therapeutic intervention in order to improve the following deficits and impairments:  Pain, Increased fascial restricitons, Decreased strength, Decreased range of motion, Increased edema  Visit Diagnosis: Pain in right lower leg  Pain of right heel     Problem List Patient Active Problem List   Diagnosis Date Noted  . ANKLE PAIN, RIGHT 02/18/2010  . SINUS TARSI SYNDROME, RIGHT FOOT 02/18/2010  . HYPERTENSION 05/09/2008  . PAROXYSMAL ATRIAL FIBRILLATION 05/09/2008  . ACHILLES BURSITIS OR TENDINITIS 12/02/2007  . HEEL PAIN, RIGHT 12/02/2007  . ACHILLES TENDON TEAR 12/02/2007   Carolyne Littles PT DPT  06/03/2017  Cooper Render  SPT  06/03/2017, 11:50 AM  During this treatment session, the therapist was present, participating in and directing the treatment.  [place], [place], [address] Phone: [phone]   Fax:  [phone]  Name: Leonard Campbell MRN: [id number] Date of Birth: [birth year]

## 2017-06-07 MED FILL — XARELTO 20 MG TABLET: 20 | 30 days supply | Qty: 30 | Fill #4

## 2017-06-07 MED FILL — LISINOPRIL-HCTZ 20-25 MG TA: 20-25 | 90 days supply | Qty: 90 | Fill #0

## 2017-06-11 ENCOUNTER — Encounter: Payer: Self-pay | Admitting: Physical Therapy

## 2017-06-11 ENCOUNTER — Ambulatory Visit: Payer: 59 | Admitting: Physical Therapy

## 2017-06-11 DIAGNOSIS — M79661 Pain in right lower leg: Secondary | ICD-10-CM

## 2017-06-11 DIAGNOSIS — M79671 Pain in right foot: Secondary | ICD-10-CM | POA: Diagnosis not present

## 2017-06-11 NOTE — Therapy (Addendum)
[place], [place], [address] Phone: [phone]   Fax:  [phone]  Physical Therapy Treatment  Patient Details  Name: ANIKEN MONESTIME MRN: [id number] Date of Birth: May 17, 1950 Referring Provider: Dr. Rhona Raider    Encounter Date: 06/11/2017  PT End of Session - 06/11/17 1008    Visit Number  3    Number of Visits  5    Date for PT Re-Evaluation  06/25/17    PT Start Time  0933    PT Stop Time  1015    PT Time Calculation (min)  42 min    Activity Tolerance  Patient tolerated treatment well    Behavior During Therapy  [facility] for tasks assessed/performed       Past Medical History:  Diagnosis Date  . Achilles bursitis or tendinitis   . ACHILLES TENDON TEAR   . ANKLE PAIN, RIGHT   . Complication of anesthesia    None per pt!  Marland Kitchen HEEL PAIN, RIGHT   . Hemorrhoids   . HYPERTENSION   . PAROXYSMAL ATRIAL FIBRILLATION   . SINUS TARSI SYNDROME, RIGHT FOOT     Past Surgical History:  Procedure Laterality Date  . CARDIOVERSION N/A 08/15/2015   Procedure: CARDIOVERSION;  Surgeon: Josue Hector, MD;  Location: [facility] ENDOSCOPY;  Service: Cardiovascular;  Laterality: N/A;  . MASS EXCISION Right 10/22/2015   Procedure: EXCISION MASS RIGHT THUMB;  Surgeon: Daryll Brod, MD;  Location: [place];  Service: Orthopedics;  Laterality: Right;  . TONSILLECTOMY      There were no vitals filed for this visit.  Subjective Assessment - 06/11/17 0932    Subjective  Patient reports that his ankle is at about a 2/10 today and fells a little stiff. Patient says that the tape stayed on for 3 days and seemed to really help. Patient states that he was able to do his HEP some but not as much as he should have but did not have any irritation with the exercises.     Pertinent History  4-5 yrs ago tore Achilles on Rt. side , sinus tarsi Rt. foot.     Limitations  Sitting;Walking    How long can you sit comfortably?   stiffness if he sits too long     How long can you walk comfortably?  walking is oK but pain post     Diagnostic tests  bone spur Rt. insertion of Achilles     Patient Stated Goals  Patient would like to avoid surgery.     Currently in Pain?  Yes    Pain Score  2     Pain Location  Ankle    Pain Orientation  Right    Pain Descriptors / Indicators  Tightness    Pain Type  Chronic pain    Pain Onset  More than a month ago    Pain Frequency  Intermittent    Aggravating Factors   jumping, running, overactivity or it he sits to long in one place     Pain Relieving Factors  gentle activity     Effect of Pain on Daily Activities  min discomfort at times                        [facility] Adult PT Treatment/Exercise - 06/11/17 0001      Manual Therapy   Manual therapy comments  IASTM; Gastroc/Soleus     Kinesiotex  Facilitate Muscle  Kinesiotix   Facilitate Muscle   Gastroc; bottom of the calcanous to head of gastroc; perpendicular to tendon       Ankle Exercises: Stretches   Other Stretch  slant board 3x20sec       Ankle Exercises: Standing   Heel Raises  --    Other Standing Ankle Exercises  SLS; 3x 10 sec; eccentric heel raises  2x10     Other Standing Ankle Exercises  Standing march 2x20 5 sec; step up 4 in 2x10              PT Education - 06/11/17 1007    Education Details  Instructed pt on how to use kines tape at home     Person(s) Educated  Patient    Methods  Explanation;Demonstration;Tactile cues;Verbal cues    Comprehension  Verbalized understanding          PT Long Term Goals - 05/21/17 1039      PT LONG TERM GOAL #1   Title  Pt will I with HEP for ankle, LE strength and flexibilty     Time  4    Period  Weeks    Status  New    Target Date  06/25/17      PT LONG TERM GOAL #2   Title  Pt will be able to report 25% less stiffness in ankle, pain after sitting for meals or in AM.     Time  4    Period  Weeks    Status  New    Target  Date  06/25/17      PT LONG TERM GOAL #3   Title  FOTO score will improve to <28% impaired to demo improvement in functional mobility.     Time  4    Period  Weeks    Status  New    Target Date  06/25/17      PT LONG TERM GOAL #4   Title  Pt will be able to perfom 5 quality  heel raises on  Rt. LE no UE support to demo increased balance, strength and power.     Time  4    Period  Weeks    Status  New    Target Date  06/25/17            Plan - 06/11/17 1009    Clinical Impression Statement  Therapy continued with soft tissue work to the calf and transverse friction massage to the achilles tendon. Patient tolerated manual work well and only reported min tenderness. Patient reported that the kines tape helped after last visit so therapy reviewed with patient how to use the tape himself at home. Therapy added the elliptical, eccentric heel raises and stairs. Patient did well with all exercises and reported that he was feeling good post therapy.     Clinical Presentation  Stable    Clinical Decision Making  Low    Rehab Potential  Excellent    PT Frequency  1x / week    PT Duration  6 weeks    PT Treatment/Interventions  Taping;Iontophoresis 4mg /ml Dexamethasone;Moist Heat;Cryotherapy;Ultrasound;Therapeutic activities;Patient/family education;Neuromuscular re-education;Therapeutic exercise;Manual techniques;Passive range of motion    PT Next Visit Plan  check stretching, add eccentric loading, ionto vs tape, [place]     PT Home Exercise Plan  gastroc/soleus stretch     Consulted and Agree with Plan of Care  Patient       Patient will benefit from skilled therapeutic intervention in order to  improve the following deficits and impairments:  Pain, Increased fascial restricitons, Decreased strength, Decreased range of motion, Increased edema  Visit Diagnosis: Pain in right lower leg  Pain of right heel     Problem List Patient Active Problem List   Diagnosis Date Noted  . ANKLE  PAIN, RIGHT 02/18/2010  . SINUS TARSI SYNDROME, RIGHT FOOT 02/18/2010  . HYPERTENSION 05/09/2008  . PAROXYSMAL ATRIAL FIBRILLATION 05/09/2008  . ACHILLES BURSITIS OR TENDINITIS 12/02/2007  . HEEL PAIN, RIGHT 12/02/2007  . ACHILLES TENDON TEAR 12/02/2007    Carney Living PT DPT  06/11/2017, 11:23 AM Cooper Render SPT  06/11/2017  During this treatment session, the therapist was present, participating in and directing the treatment.  [place], [place], [address] Phone: [phone]   Fax:  [phone]  Name: DEMORIO SEELEY MRN: [id number] Date of Birth: 1950/12/02

## 2017-06-11 NOTE — Therapy (Signed)
[place], [place], [address] Phone: [phone]   Fax:  [phone]  Physical Therapy Treatment  Patient Details  Name: Leonard Campbell MRN: [id number] Date of Birth: 1950-04-16 Referring Provider: Dr. Rhona Raider    Encounter Date: 06/11/2017  PT End of Session - 06/11/17 1008    Visit Number  3    Number of Visits  5    Date for PT Re-Evaluation  06/25/17    PT Start Time  0933    PT Stop Time  1015    PT Time Calculation (min)  42 min    Activity Tolerance  Patient tolerated treatment well    Behavior During Therapy  [facility] for tasks assessed/performed       Past Medical History:  Diagnosis Date  . Achilles bursitis or tendinitis   . ACHILLES TENDON TEAR   . ANKLE PAIN, RIGHT   . Complication of anesthesia    None per pt!  Marland Kitchen HEEL PAIN, RIGHT   . Hemorrhoids   . HYPERTENSION   . PAROXYSMAL ATRIAL FIBRILLATION   . SINUS TARSI SYNDROME, RIGHT FOOT     Past Surgical History:  Procedure Laterality Date  . CARDIOVERSION N/A 08/15/2015   Procedure: CARDIOVERSION;  Surgeon: Josue Hector, MD;  Location: [facility] ENDOSCOPY;  Service: Cardiovascular;  Laterality: N/A;  . MASS EXCISION Right 10/22/2015   Procedure: EXCISION MASS RIGHT THUMB;  Surgeon: Daryll Brod, MD;  Location: [facility];  Service: Orthopedics;  Laterality: Right;  . TONSILLECTOMY      There were no vitals filed for this visit.  Subjective Assessment - 06/11/17 0932    Subjective  Patient reports that his ankle is at about a 2/10 today and fells a little stiff. Patient says that the tape stayed on for 3 days and seemed to really help. Patient states that he was able to do his HEP some but not as much as he should have but did not have any irritation with the exercises.     Pertinent History  4-5 yrs ago tore Achilles on Rt. side , sinus tarsi Rt. foot.     Limitations  Sitting;Walking    How long can you sit comfortably?   stiffness if he sits too long     How long can you walk comfortably?  walking is oK but pain post     Diagnostic tests  bone spur Rt. insertion of Achilles     Patient Stated Goals  Patient would like to avoid surgery.     Currently in Pain?  Yes    Pain Score  2     Pain Location  Ankle    Pain Orientation  Right    Pain Descriptors / Indicators  Tightness    Pain Type  Chronic pain    Pain Onset  More than a month ago    Pain Frequency  Intermittent    Aggravating Factors   jumping, running, overactivity or it he sits to long in one place     Pain Relieving Factors  gentle activity     Effect of Pain on Daily Activities  min discomfort at times                        [facility] Adult PT Treatment/Exercise - 06/11/17 0001      Manual Therapy   Manual therapy comments  IASTM; Gastroc/Soleus     Kinesiotex  Facilitate Muscle  Kinesiotix   Facilitate Muscle   Gastroc; bottom of the calcanous to head of gastroc; perpendicular to tendon       Ankle Exercises: Stretches   Other Stretch  slant board 3x20sec       Ankle Exercises: Standing   Heel Raises  --    Other Standing Ankle Exercises  SLS; 3x 10 sec; eccentric heel raises  2x10     Other Standing Ankle Exercises  Standing march 2x20 5 sec; step up 4 in 2x10              PT Education - 06/11/17 1007    Education Details  Instructed pt on how to use kines tape at home     Person(s) Educated  Patient    Methods  Explanation;Demonstration;Tactile cues;Verbal cues    Comprehension  Verbalized understanding          PT Long Term Goals - 05/21/17 1039      PT LONG TERM GOAL #1   Title  Pt will I with HEP for ankle, LE strength and flexibilty     Time  4    Period  Weeks    Status  New    Target Date  06/25/17      PT LONG TERM GOAL #2   Title  Pt will be able to report 25% less stiffness in ankle, pain after sitting for meals or in AM.     Time  4    Period  Weeks    Status  New    Target  Date  06/25/17      PT LONG TERM GOAL #3   Title  FOTO score will improve to <28% impaired to demo improvement in functional mobility.     Time  4    Period  Weeks    Status  New    Target Date  06/25/17      PT LONG TERM GOAL #4   Title  Pt will be able to perfom 5 quality  heel raises on  Rt. LE no UE support to demo increased balance, strength and power.     Time  4    Period  Weeks    Status  New    Target Date  06/25/17            Plan - 06/11/17 1009    Clinical Impression Statement  Therapy continued with soft tissue work to the calf and transverse friction massage to the achilles tendon. Patient tolerated manual work well and only reported min tenderness. Patient reported that the kines tape helped after last visit so therapy reviewed with patient how to use the tape himself at home. Therapy added the elliptical, eccentric heel raises and stairs. Patient did well with all exercises and reported that he was feeling good post therapy.     Clinical Presentation  Stable    Clinical Decision Making  Low    Rehab Potential  Excellent    PT Frequency  1x / week    PT Duration  6 weeks    PT Treatment/Interventions  Taping;Iontophoresis 4mg /ml Dexamethasone;Moist Heat;Cryotherapy;Ultrasound;Therapeutic activities;Patient/family education;Neuromuscular re-education;Therapeutic exercise;Manual techniques;Passive range of motion    PT Next Visit Plan  check stretching, add eccentric loading, ionto vs tape, [place]     PT Home Exercise Plan  gastroc/soleus stretch     Consulted and Agree with Plan of Care  Patient       Patient will benefit from skilled therapeutic intervention in order to  improve the following deficits and impairments:  Pain, Increased fascial restricitons, Decreased strength, Decreased range of motion, Increased edema  Visit Diagnosis: Pain in right lower leg  Pain of right heel     Problem List Patient Active Problem List   Diagnosis Date Noted  . ANKLE  PAIN, RIGHT 02/18/2010  . SINUS TARSI SYNDROME, RIGHT FOOT 02/18/2010  . HYPERTENSION 05/09/2008  . PAROXYSMAL ATRIAL FIBRILLATION 05/09/2008  . ACHILLES BURSITIS OR TENDINITIS 12/02/2007  . HEEL PAIN, RIGHT 12/02/2007  . ACHILLES TENDON TEAR 12/02/2007    Carney Living 06/11/2017, 10:22 AM  [facility] [address] [place], [place], [address] Phone: [phone]   Fax:  [phone]  Name: Leonard Campbell MRN: [id number] Date of Birth: [birth year]

## 2017-06-16 ENCOUNTER — Encounter: Payer: Self-pay | Admitting: Physical Therapy

## 2017-06-16 ENCOUNTER — Ambulatory Visit: Payer: 59 | Attending: Orthopaedic Surgery | Admitting: Physical Therapy

## 2017-06-16 DIAGNOSIS — M79671 Pain in right foot: Secondary | ICD-10-CM | POA: Insufficient documentation

## 2017-06-16 DIAGNOSIS — M79661 Pain in right lower leg: Secondary | ICD-10-CM | POA: Insufficient documentation

## 2017-06-16 NOTE — Therapy (Signed)
[place], [place], [address] Phone: [phone]   Fax:  [phone]  Physical Therapy Treatment  Patient Details  Name: Leonard Campbell MRN: [id number] Date of Birth: Oct 23, 1950 Referring Provider: Dr. Rhona Raider    Encounter Date: 06/16/2017    Past Medical History:  Diagnosis Date  . Achilles bursitis or tendinitis   . ACHILLES TENDON TEAR   . ANKLE PAIN, RIGHT   . Complication of anesthesia    None per pt!  Marland Kitchen HEEL PAIN, RIGHT   . Hemorrhoids   . HYPERTENSION   . PAROXYSMAL ATRIAL FIBRILLATION   . SINUS TARSI SYNDROME, RIGHT FOOT     Past Surgical History:  Procedure Laterality Date  . CARDIOVERSION N/A 08/15/2015   Procedure: CARDIOVERSION;  Surgeon: Josue Hector, MD;  Location: [facility] ENDOSCOPY;  Service: Cardiovascular;  Laterality: N/A;  . MASS EXCISION Right 10/22/2015   Procedure: EXCISION MASS RIGHT THUMB;  Surgeon: Daryll Brod, MD;  Location: [place];  Service: Orthopedics;  Laterality: Right;  . TONSILLECTOMY      There were no vitals filed for this visit.  Subjective Assessment - 06/16/17 1021    Subjective  50 %  morning  stiffness improvement.  Ankle swollen this morning.  Tape really helps.    Currently in Pain?  Yes    Pain Score  1     Pain Location  Ankle    Pain Orientation  Right    Pain Descriptors / Indicators  Tightness    Pain Type  Chronic pain    Pain Frequency  Intermittent    Aggravating Factors   first thing in the morning    Pain Relieving Factors  gentle activity    Multiple Pain Sites  No                       [facility] Adult PT Treatment/Exercise - 06/16/17 0001      Self-Care   Self-Care  -- foot anatomy, how to decrease edema      Manual Therapy   Manual therapy comments  retrograde soft tissue work with lymph activation medial knee,  heel distraction with supination/pronation.   increased mobility noted mid  foot,  heel,edema visably reduced to almost equal to opposite foot.     Joint Mobilization  Mobs with movement with strap, P.A glides mid to distal foot and fibula      Kinesiotix   Create Space  spur area posterior heel  50% stretch middle third    Inhibit Muscle   Gastroc  starting distal at heel      Ankle Exercises: Standing   Other Standing Ankle Exercises  SLS; 3x 10 sec; eccentric heel raises  2x10       Ankle Exercises: Stretches   Other Stretch  slant board 3 X 30 sec  cued for a mild stretch             PT Education - 06/16/17 1126    Education provided  Yes    Education Details  anatomy foot    Person(s) Educated  Patient    Methods  Explanation;Demonstration    Comprehension  Verbalized understanding          PT Long Term Goals - 06/16/17 1123      PT LONG TERM GOAL #1   Title  Pt will I with HEP for ankle, LE strength and flexibilty     Baseline  independent  so far.  Not consistant with frequency due to it being feeling better.    Time  4    Period  Weeks    Status  On-going      PT LONG TERM GOAL #2   Title  Pt will be able to report 25% less stiffness in ankle, pain after sitting for meals or in AM.     Baseline  50 % less stiffness in the morning.    Time  4    Period  Weeks    Status  Partially Met      PT LONG TERM GOAL #3   Title  FOTO score will improve to <28% impaired to demo improvement in functional mobility.     Time  4    Period  Weeks    Status  On-going      PT LONG TERM GOAL #4   Title  Pt will be able to perfom 5 quality  heel raises on  Rt. LE no UE support to demo increased balance, strength and power.     Baseline  heel lifts both vs single    Time  4    Period  Weeks    Status  On-going            Plan - 06/16/17 1127    Clinical Impression Statement  STG#2 partially met.  Edema greatly improved. Mid and rear foot less tight post session. No pain at end of session.    PT Next Visit Plan  continue to stretch.  Teach wift  how to tape , continue eccentric loading.     PT Home Exercise Plan  gastroc/soleus stretch ,  heel lifts with slow lower.     Consulted and Agree with Plan of Care  Patient       Patient will benefit from skilled therapeutic intervention in order to improve the following deficits and impairments:     Visit Diagnosis: Pain in right lower leg  Pain of right heel     Problem List Patient Active Problem List   Diagnosis Date Noted  . ANKLE PAIN, RIGHT 02/18/2010  . SINUS TARSI SYNDROME, RIGHT FOOT 02/18/2010  . HYPERTENSION 05/09/2008  . PAROXYSMAL ATRIAL FIBRILLATION 05/09/2008  . ACHILLES BURSITIS OR TENDINITIS 12/02/2007  . HEEL PAIN, RIGHT 12/02/2007  . ACHILLES TENDON TEAR 12/02/2007    HARRIS,KAREN PTA 06/16/2017, 11:31 AM  [facility] [address] [place], [place], [address] Phone: [phone]   Fax:  [phone]  Name: Leonard Campbell MRN: [id number] Date of Birth: 1950-03-03

## 2017-06-18 DIAGNOSIS — M25571 Pain in right ankle and joints of right foot: Secondary | ICD-10-CM | POA: Diagnosis not present

## 2017-06-23 ENCOUNTER — Ambulatory Visit: Payer: 59 | Admitting: Physical Therapy

## 2017-06-23 ENCOUNTER — Encounter: Payer: Self-pay | Admitting: Physical Therapy

## 2017-06-23 DIAGNOSIS — M79671 Pain in right foot: Secondary | ICD-10-CM

## 2017-06-23 DIAGNOSIS — M79661 Pain in right lower leg: Secondary | ICD-10-CM

## 2017-06-23 NOTE — Therapy (Signed)
[place], [place], [address] Phone: [phone]   Fax:  [phone]  Physical Therapy Treatment/Discharge  Patient Details  Name: Leonard Campbell MRN: [id number] Date of Birth: [birth year] Referring Provider: Dr. Rhona Raider    Encounter Date: 06/23/2017  PT End of Session - 06/23/17 0932    Visit Number  4    Number of Visits  5    Date for PT Re-Evaluation  06/25/17    PT Start Time  0930    PT Stop Time  1010    PT Time Calculation (min)  40 min    Activity Tolerance  Patient tolerated treatment well    Behavior During Therapy  [facility] for tasks assessed/performed       Past Medical History:  Diagnosis Date  . Achilles bursitis or tendinitis   . ACHILLES TENDON TEAR   . ANKLE PAIN, RIGHT   . Complication of anesthesia    None per pt!  Marland Kitchen HEEL PAIN, RIGHT   . Hemorrhoids   . HYPERTENSION   . PAROXYSMAL ATRIAL FIBRILLATION   . SINUS TARSI SYNDROME, RIGHT FOOT     Past Surgical History:  Procedure Laterality Date  . CARDIOVERSION N/A 08/15/2015   Procedure: CARDIOVERSION;  Surgeon: Josue Hector, MD;  Location: [facility] ENDOSCOPY;  Service: Cardiovascular;  Laterality: N/A;  . MASS EXCISION Right 10/22/2015   Procedure: EXCISION MASS RIGHT THUMB;  Surgeon: Daryll Brod, MD;  Location: [place];  Service: Orthopedics;  Laterality: Right;  . TONSILLECTOMY      There were no vitals filed for this visit.  Subjective Assessment - 06/23/17 0934    Subjective  Patient can't remember the last time he had heel pain.  Would like to learn how to tape.  Goign to [place] Friday, [place] for [place].     Currently in Pain?  No/denies             [facility] Adult PT Treatment/Exercise - 06/23/17 0001      Kinesiotix   Create Space  spur area posterior heel  50% stretch middle third    Inhibit Muscle   Gastroc  starting distal at heel cues, pictures and demo       Ankle Exercises: Aerobic   Elliptical  5 min  L 10 ramp, L2 resist , no pain       Ankle Exercises: Stretches   Plantar Fascia Stretch  3 reps;30 seconds    Soleus Stretch  2 reps;30 seconds    Gastroc Stretch  2 reps;30 seconds      Ankle Exercises: Standing   Heel Raises  Both;10 reps;20 reps    Heel Raises Limitations  variations bilateral, unilateral and eccentric Rt LE     Other Standing Ankle Exercises  30 sec each LE              PT Education - 06/23/17 1244    Education provided  Yes    Education Details  kinesiotape    Person(s) Educated  Patient    Methods  Explanation    Comprehension  Verbalized understanding;Returned demonstration;Tactile cues required          PT Long Term Goals - 06/23/17 0937      PT LONG TERM GOAL #1   Title  Pt will I with HEP for ankle, LE strength and flexibilty     Status  Achieved      PT LONG TERM GOAL #2   Title  Pt will be able to report 25% less stiffness in ankle, pain after sitting for meals or in AM.     Baseline  min stiffness in AM, much improved     Status  Partially Met      PT LONG TERM GOAL #3   Title  FOTO score will improve to <28% impaired to demo improvement in functional mobility.     Baseline  36%, 2% better , patient stated he could not hop    Status  Not Met      PT LONG TERM GOAL #4   Title  Pt will be able to perfom 5 quality  heel raises on  Rt. LE no UE support to demo increased balance, strength and power.     Baseline  decreased ROM with UE touch    Status  Partially Met            Plan - 06/23/17 1249    Clinical Impression Statement  Patient felt pleased with his current functional ability.  Unable to perform heel raises full ROM.  Decreased stability with Static SLS.  DC from PT .      PT Next Visit Plan  DC    PT Home Exercise Plan  gastroc/soleus stretch ,  heel lifts with slow lower.     Consulted and Agree with Plan of Care  Patient       Patient will benefit from skilled therapeutic intervention in order to improve the  following deficits and impairments:  Pain, Increased fascial restricitons, Decreased strength, Decreased range of motion, Increased edema  Visit Diagnosis: Pain in right lower leg  Pain of right heel     Problem List Patient Active Problem List   Diagnosis Date Noted  . ANKLE PAIN, RIGHT 02/18/2010  . SINUS TARSI SYNDROME, RIGHT FOOT 02/18/2010  . HYPERTENSION 05/09/2008  . PAROXYSMAL ATRIAL FIBRILLATION 05/09/2008  . ACHILLES BURSITIS OR TENDINITIS 12/02/2007  . HEEL PAIN, RIGHT 12/02/2007  . ACHILLES TENDON TEAR 12/02/2007    Shoua Ressler 06/23/2017, 12:56 PM  [facility] [address] [place], [place], [address] Phone: [phone]   Fax:  [phone]  Name: Leonard Campbell MRN: [id number] Date of Birth: [birth year]   PHYSICAL THERAPY DISCHARGE SUMMARY  Visits from Start of Care: 4  Current functional level related to goals / functional outcomes: See above, not limited by pain    Remaining deficits: Plantaflexion strength WFL but not full ROM in standing   Education / Equipment: HEP, POC Plan: Patient agrees to discharge.  Patient goals were partially met. Patient is being discharged due to being pleased with the current functional level.  ?????    Raeford Razor, PT 06/23/17 12:57 PM Phone: [phone] Fax: [phone]

## 2017-07-09 ENCOUNTER — Other Ambulatory Visit: Payer: Self-pay | Admitting: Cardiovascular Disease

## 2017-07-09 MED FILL — XARELTO 20 MG TABLET: 20 | 30 days supply | Qty: 30 | Fill #0

## 2017-07-09 NOTE — Telephone Encounter (Signed)
Pt last saw Dr Johnsie Cancel 06/10/16, has 1 year f/u scheduled for 07/16/17.  Last labs 03/27/16 Creat 1.0, placed note on upcoming appt with Dr Johnsie Cancel needs CBC and BMP repeated at [place] f/u [place].  Age 67, weight 101.6, CrCl 103.01, based on CrCl pt is on appropriate dosage.  Will refill rx x 1 month and await more recent labwork at time of OV to calculate CrCl.

## 2017-07-10 DIAGNOSIS — M791 Myalgia, unspecified site: Secondary | ICD-10-CM | POA: Diagnosis not present

## 2017-07-13 DIAGNOSIS — M2550 Pain in unspecified joint: Secondary | ICD-10-CM | POA: Diagnosis not present

## 2017-07-13 MED FILL — predniSONE 20 MG TABS: 20 | 9 days supply | Qty: 18 | Fill #0

## 2017-07-13 MED FILL — traMADol HCL 50 MG TABS: 50 | 3 days supply | Qty: 12 | Fill #0

## 2017-07-15 NOTE — Progress Notes (Deleted)
Patient ID: Leonard Campbell, male   DOB: 03-04-1950, 67 y.o.   MRN: [id number]   67 y.o. f/u HTN, PAF CHA2VASC 2.  He failed [facility] June 2017 but subsequently spontaneously converted Leonard Campbell is a good friend of my Leonard and takes his blood pressure at home. Does not follow a particular diet. He is compliant with his medications. He has not noticed any palpitations PND orthopnea chest pain or lower extremity edema. One daughter living with him now. Still working at [employer]  His primary is Dr Wyline Mood and he had a recent physical with lab work  Does not need any refills  Doing lots of yard work   Asymptomatic PVCls   68/31/16 had normal stress echo   Failed [facility] on 08/15/15 Seen by Dr Lovena Le who thought he was in afib not flutter and  Since not symptomatic did not favor AAT or ablation    ROS: Denies fever, malais, weight loss, blurry vision, decreased visual acuity, cough, sputum, SOB, hemoptysis, pleuritic pain, palpitaitons, heartburn, abdominal pain, melena, lower extremity edema, claudication, or rash.  All other systems reviewed and negative  General: There were no vitals taken for this visit. Affect appropriate Healthy:  appears stated age 67: normal Neck supple with no adenopathy JVP normal no bruits no thyromegaly Lungs clear with no wheezing and good diaphragmatic motion Heart:  S1/S2 no murmur, no rub, gallop or click PMI normal Abdomen: benighn, BS positve, no tenderness, no AAA no bruit.  No HSM or HJR Distal pulses intact with no bruits No edema Neuro non-focal Skin warm and dry No muscular weakness    Current Outpatient Medications  Medication Sig Dispense Refill  . amLODipine (NORVASC) 10 MG tablet Take 10 mg by mouth daily.  3  . lisinopril-hydrochlorothiazide (PRINZIDE,ZESTORETIC) 20-25 MG tablet Take 1 tablet by mouth daily. 90 tablet 3  . Na Sulfate-K Sulfate-Mg Sulf (SUPREP BOWEL PREP KIT) 17.5-3.13-1.6 GM/180ML SOLN Suprep as directed / no substitutions  (Patient not taking: Reported on 05/21/2017) 354 mL 0  . XARELTO 20 MG TABS tablet TAKE 1 TABLET BY MOUTH ONCE DAILY WITH SUPPER 30 tablet 1   Current Facility-Administered Medications  Medication Dose Route Frequency Provider Last Rate Last Dose  . 0.9 %  sodium chloride infusion  500 mL Intravenous Continuous Milus Banister, MD        Allergies  Patient has no known allergies.  Electrocardiogram: 10/09/15  SR LVH PVC;s no acute changes   Assessment and Plan  HTN:  Well controlled.  Continue current medications and low sodium Dash type diet.   PVC;s  Asymptomatic  Normal stress echo 5/16 PAF:  Failed DCC and spontaneous conversion when seen by Dr Lovena Le on 10/09/15  His note indicated no AAT due to lack of symptoms On xarelto CHA2VASC 2 Has held xarelto before with no issues    f/U with me  In a year    [name]

## 2017-07-16 ENCOUNTER — Ambulatory Visit: Payer: 59 | Admitting: Cardiovascular Disease

## 2017-07-16 ENCOUNTER — Encounter: Payer: Self-pay | Admitting: Cardiovascular Disease

## 2017-07-16 DIAGNOSIS — R0989 Other specified symptoms and signs involving the circulatory and respiratory systems: Secondary | ICD-10-CM

## 2017-08-09 ENCOUNTER — Other Ambulatory Visit: Payer: Self-pay | Admitting: Family Medicine

## 2017-08-09 ENCOUNTER — Ambulatory Visit
Admission: RE | Admit: 2017-08-09 | Discharge: 2017-08-09 | Disposition: A | Discharge status: Home / Self Care | Payer: 59 | Source: Ambulatory Visit | Attending: Family Medicine | Admitting: Family Medicine

## 2017-08-09 DIAGNOSIS — M25551 Pain in right hip: Secondary | ICD-10-CM

## 2017-08-09 DIAGNOSIS — M25511 Pain in right shoulder: Secondary | ICD-10-CM

## 2017-08-09 DIAGNOSIS — M2550 Pain in unspecified joint: Secondary | ICD-10-CM | POA: Diagnosis not present

## 2017-08-09 DIAGNOSIS — M25512 Pain in left shoulder: Secondary | ICD-10-CM | POA: Diagnosis not present

## 2017-08-09 DIAGNOSIS — M25552 Pain in left hip: Secondary | ICD-10-CM | POA: Diagnosis not present

## 2017-08-09 DIAGNOSIS — M19011 Primary osteoarthritis, right shoulder: Secondary | ICD-10-CM | POA: Diagnosis not present

## 2017-08-09 DIAGNOSIS — M160 Bilateral primary osteoarthritis of hip: Secondary | ICD-10-CM | POA: Diagnosis not present

## 2017-08-09 DIAGNOSIS — M19012 Primary osteoarthritis, left shoulder: Secondary | ICD-10-CM | POA: Diagnosis not present

## 2017-08-09 MED FILL — AMLODIPINE BESYLATE 10 MG T: 10 | 90 days supply | Qty: 90 | Fill #1

## 2017-08-09 MED FILL — XARELTO 20 MG TABLET: 20 | 30 days supply | Qty: 30 | Fill #1

## 2017-08-24 DIAGNOSIS — R972 Elevated prostate specific antigen [PSA]: Secondary | ICD-10-CM | POA: Diagnosis not present

## 2017-08-25 DIAGNOSIS — M19012 Primary osteoarthritis, left shoulder: Secondary | ICD-10-CM | POA: Diagnosis not present

## 2017-08-25 DIAGNOSIS — M19011 Primary osteoarthritis, right shoulder: Secondary | ICD-10-CM | POA: Diagnosis not present

## 2017-08-31 ENCOUNTER — Other Ambulatory Visit: Payer: Self-pay | Admitting: Cardiovascular Disease

## 2017-08-31 MED FILL — LISINOPRIL-HCTZ 20-25 MG TA: 20-25 | 90 days supply | Qty: 90 | Fill #1

## 2017-08-31 NOTE — Telephone Encounter (Signed)
Xarelto 20mg  refill request received; pt is 67 yrs old, wt-101.6kg, Crea-1.00 on 07/10/17, last seen by Dr. Johnsie Cancel on 06/10/16 and has an appt on 10/04/17 at 945am, CrCl-103.72ml/min; will send in refill to requested pharmacy.

## 2017-09-02 ENCOUNTER — Other Ambulatory Visit: Payer: Self-pay | Admitting: Urology

## 2017-09-02 DIAGNOSIS — R972 Elevated prostate specific antigen [PSA]: Secondary | ICD-10-CM | POA: Diagnosis not present

## 2017-09-02 DIAGNOSIS — N5201 Erectile dysfunction due to arterial insufficiency: Secondary | ICD-10-CM | POA: Diagnosis not present

## 2017-09-02 MED FILL — XARELTO 20 MG TABLET: 20 | 30 days supply | Qty: 30 | Fill #0

## 2017-09-21 ENCOUNTER — Other Ambulatory Visit: Payer: 59

## 2017-09-22 NOTE — Progress Notes (Signed)
Office Visit Note  Patient: Leonard Campbell             Date of Birth: 1951/01/21           MRN: [id number]             PCP: Gaynelle Arabian, MD Referring: Gaynelle Arabian, MD Visit Date: 10/06/2017 Occupation: @GUAROCC @  Subjective:  Pain in Campbell shoulders  History of Present Illness: Leonard Campbell is a 67 y.o. male seen in consultation per request of his PCP.  According to patient in April 2019 after doing yard work he woke up the next day with bilateral shoulder joint pain.  He states the pain was so bad that he had difficulty getting out of the bed.  He went to the urgent care where he was given prednisone and the symptoms resolved but the symptoms recurred as soon as the prednisone was finished.  He went to see Dr. Debbora Dus who gave him bilateral shoulder joint injections which helped for a short duration and then the symptoms recurred again.  He was given a handout for shoulder joint exercises which he has been doing at home.  He states the pain recurred again and he had right shoulder joint cortisone injection yesterday by Dr. Demetrius Revel.  He has noticed some improvement in his symptoms.  He denies pain and stiffness in any other joints.  He denies any pain in his hip joints or difficulty getting out of the chair.  Activities of Daily Living:  Patient reports morning stiffness for 30 minutes.   Patient Denies nocturnal pain.  Difficulty dressing/grooming: Denies Difficulty climbing stairs: Denies Difficulty getting out of chair: Denies Difficulty using hands for taps, buttons, cutlery, and/or writing: Denies  Review of Systems  Constitutional: Negative for fatigue, fever and night sweats.  HENT: Negative for mouth sores, trouble swallowing, trouble swallowing, loss of smell, mouth dryness and nose dryness.   Eyes: Negative for pain, redness and dryness.  Respiratory: Negative for shortness of breath and difficulty breathing.   Cardiovascular: Negative for chest pain,  palpitations, hypertension, irregular heartbeat and swelling in legs/feet.  Gastrointestinal: Negative for abdominal pain, constipation, diarrhea, nausea and vomiting.  Endocrine: Negative for increased urination.  Genitourinary: Negative for pelvic pain.  Musculoskeletal: Positive for arthralgias, joint pain and morning stiffness. Negative for joint swelling, myalgias, muscle weakness, muscle tenderness and myalgias.  Skin: Negative for color change, rash, hair loss, nodules/bumps, skin tightness, ulcers and sensitivity to sunlight.  Allergic/Immunologic: Negative for susceptible to infections.  Neurological: Negative for dizziness, fainting, light-headedness, headaches, memory loss, night sweats and weakness.  Hematological: Negative for bruising/bleeding tendency and swollen glands.  Psychiatric/Behavioral: Negative for depressed mood, confusion and sleep disturbance. The patient is not nervous/anxious.     PMFS History:  Patient Active Problem List   Diagnosis Date Noted  . ANKLE PAIN, RIGHT 02/18/2010  . SINUS TARSI SYNDROME, RIGHT FOOT 02/18/2010  . HYPERTENSION 05/09/2008  . PAROXYSMAL ATRIAL FIBRILLATION 05/09/2008  . ACHILLES BURSITIS OR TENDINITIS 12/02/2007  . HEEL PAIN, RIGHT 12/02/2007  . ACHILLES TENDON TEAR 12/02/2007    Past Medical History:  Diagnosis Date  . Achilles bursitis or tendinitis   . ACHILLES TENDON TEAR   . ANKLE PAIN, RIGHT   . Complication of anesthesia    None per pt!  Marland Kitchen HEEL PAIN, RIGHT   . Hemorrhoids   . HYPERTENSION   . PAROXYSMAL ATRIAL FIBRILLATION   . SINUS TARSI SYNDROME, RIGHT FOOT     Family History  Problem Relation Age of Onset  . Heart failure Mother        CHF  . Hypertension Mother   . Kidney disease Mother   . Hypertension Brother   . Congestive Heart Failure Brother   . Breast cancer Maternal Aunt   . Lung cancer Maternal Aunt   . Healthy Daughter   . Healthy Daughter    Past Surgical History:  Procedure Laterality  Date  . CARDIOVERSION N/A 08/15/2015   Procedure: CARDIOVERSION;  Surgeon: Josue Hector, MD;  Location: [facility] ENDOSCOPY;  Service: Cardiovascular;  Laterality: N/A;  . MASS EXCISION Right 10/22/2015   Procedure: EXCISION MASS RIGHT THUMB;  Surgeon: Daryll Brod, MD;  Location: [place];  Service: Orthopedics;  Laterality: Right;  . TONSILLECTOMY     Social History   Social History Narrative  . Not on file    Objective: Vital Signs: BP 129/85 (BP Location: Right Arm, Patient Position: Sitting, Cuff Size: Normal)   Pulse (!) 51   Resp 15   Ht 6' (1.829 m)   Wt 213 lb 6.4 oz (96.8 kg)   BMI 28.94 kg/m    Physical Exam  Constitutional: He is oriented to person, place, and time. He appears well-developed and well-nourished.  HENT:  Head: Normocephalic and atraumatic.  Eyes: Pupils are equal, round, and reactive to light. Conjunctivae and EOM are normal.  Neck: Normal range of motion. Neck supple.  Cardiovascular: Normal rate, regular rhythm and normal heart sounds.  Pulmonary/Chest: Effort normal and breath sounds normal.  Abdominal: Soft. Bowel sounds are normal.  Neurological: He is alert and oriented to person, place, and time.  Skin: Skin is warm and dry. Capillary refill takes less than 2 seconds.  Psychiatric: He has a normal mood and affect. His behavior is normal.  Nursing note and vitals reviewed.    Musculoskeletal Exam: C-spine thoracic and lumbar spine good range of motion.  He had good range of motion of bilateral shoulders with minimal discomfort today.  Elbow joints wrist joint MCPs PIPs DIPs were in good range of motion with no synovitis.  Hip joints knee joints ankles MTPs PIPs were in good range of motion with no synovitis.  He no muscular weakness or tenderness on examination today.  He had no difficulty getting out of the chair.  CDAI Exam: No CDAI exam completed.   Investigation: No additional findings.  Imaging: Mr Prostate W Wo  Contrast  Result Date: 09/27/2017 CLINICAL DATA:  Elevated PSA.  Prostate biopsy 02/12/2017 EXAM: MR PROSTATE WITHOUT AND WITH CONTRAST TECHNIQUE: Multiplanar multisequence MRI images were obtained of the pelvis centered about the prostate. Pre and post contrast images were obtained. CONTRAST:  21mL MULTIHANCE GADOBENATE DIMEGLUMINE 529 MG/ML IV SOLN Creatinine was obtained on site at [facility] at [address] Results: Creatinine 1.1 mg/dL. COMPARISON:  09/27/2017 FINDINGS: Prostate: Subtle region of low signal intensity on T2 weighted imaging within the peripheral zone localizing to the RIGHT mid gland and measuring 10 mm x 7 mm (image 17/8). There is questionable restricted diffusion abnormality at this level (image 16/11). No clear abnormal enhancement. No additional suspicious lesions in the peripheral zone. The transitional zone is enlarged by capsulated nodules. Prostatic capsule is intact. Seminal vesicles normal. Volume: 5.6 x 4.0 x 6.2 cm (volume = 73 cm^3) Transcapsular spread:  Absent Seminal vesicle involvement: Absent Neurovascular bundle involvement: Absent Pelvic adenopathy: Absent Bone metastasis: Absent Other findings: Absent IMPRESSION: 1. Indeterminate lesion in the RIGHT mid gland  with questionable restricted diffusion (PI-RADS 3). 2. Nodular transitional zone most consistent benign prostate hypertrophy. Electronically Signed   By: Suzy Bouchard M.D.   On: 09/27/2017 11:58    Recent Labs: Lab Results  Component Value Date   WBC 5.2 08/09/2015   HGB 15.9 08/09/2015   PLT 190 08/09/2015   NA 142 10/17/2015   K 3.6 10/17/2015   CL 108 10/17/2015   CO2 27 10/17/2015   GLUCOSE 81 10/17/2015   BUN 12 10/17/2015   CREATININE 1.22 10/17/2015   CALCIUM 9.3 10/17/2015   GFRAA >60 10/17/2015    Speciality Comments: No specialty comments available.  Procedures:  No procedures performed Allergies: Patient has no known allergies.   Assessment / Plan:     Visit  Diagnoses: Chronic pain of Campbell shoulders -patient complains of pain in bilateral shoulders which started suddenly after gardening.  He had good response to cortisone injections.  The x-rays showed acromioclavicular arthritis.  I discussed possibility of frozen shoulder.  I would like to observe at this time.  He had no clinical features of polymyalgia rheumatica.  He has been doing some exercises at home which are helpful.  08/13/17: RF <10, Sed rate 34 - Plan: Sedimentation rate, Angiotensin converting enzyme, Cyclic citrul peptide antibody, IgG, ANA, Uric acid, 14-3-3 eta Protein, CBC with Differential/Platelet, COMPLETE METABOLIC PANEL WITH GFR  Bilateral acromioclavicular joint arthritis - XR 08/09/17: R-mild to slightly moderate AC joint degenerative changesL-mild AC degenerative changes  Myalgia - CPK 77  Primary osteoarthritis of Campbell hips -he is not complaining of hip joint discomfort currently.  XR on 08/09/17: Mild bilateral hip degenerative change, greater on right   History of atrial fibrillation - On Xarelto  Essential hypertension-his blood pressure is controlled.  Hx of colonic polyps  Proteinuria, unspecified type   Orders: Orders Placed This Encounter  Procedures  . Sedimentation rate  . Angiotensin converting enzyme  . Cyclic citrul peptide antibody, IgG  . ANA  . Uric acid  . 14-3-3 eta Protein  . CBC with Differential/Platelet  . COMPLETE METABOLIC PANEL WITH GFR   No orders of the defined types were placed in this encounter.   Face-to-face time spent with patient was 45 minutes. Greater than 50% of time was spent in counseling and coordination of care.  Follow-Up Instructions: Return for Pain shoulders.   Bo Merino, MD  Note - This record has been created using Editor, commissioning.  Chart creation errors have been sought, but may not always  have been located. Such creation errors do not reflect on  the standard of medical care.

## 2017-09-27 ENCOUNTER — Ambulatory Visit
Admission: RE | Admit: 2017-09-27 | Discharge: 2017-09-27 | Disposition: A | Discharge status: Home / Self Care | Payer: 59 | Source: Ambulatory Visit | Attending: Urology | Admitting: Urology

## 2017-09-27 DIAGNOSIS — R972 Elevated prostate specific antigen [PSA]: Secondary | ICD-10-CM | POA: Diagnosis not present

## 2017-09-27 MED ORDER — GADOBENATE DIMEGLUMINE 529 MG/ML IV SOLN
20.0000 mL | Freq: Once | INTRAVENOUS | Status: AC | PRN
  Administered 2017-09-27: 20 mL via INTRAVENOUS

## 2017-09-30 DIAGNOSIS — M25511 Pain in right shoulder: Secondary | ICD-10-CM | POA: Diagnosis not present

## 2017-10-03 NOTE — Progress Notes (Signed)
Patient ID: Leonard Campbell, male   DOB: [birth year], 67 y.o.   MRN: [id number]   Leonard Campbell is seen today in followup for paroxysmal atrial fibrillation hypertension. He has maintained sinus rhythm for quite some time. His [place] score is 2 with hypertension and age . His blood pressure well controlled. His wife Leonard Campbell is a good friend of my wife and I and takes his blood pressure home. Does not follow a particular diet. He is compliant with his medications. He has not noticed any palpitations PND orthopnea chest pain or lower extremity edema. One daughter living with him now. Still working at [employer]  His primary is Dr Leonard Campbell and he had a recent physical with lab work He tends not to know when he is in [place] but last two office visits has been in [place]  Does not need any refills  Doing lots of yard work   Asymptomatic PVCls   07/17/14 had normal stress echo   This patients CHA2DS2-VASc Score and unadjusted Ischemic Stroke Rate (% per year) is equal to 2.2 % stroke rate/year from a score of 2  Above score calculated as 1 point each if present [CHF, HTN, DM, Vascular=MI/PAD/Aortic Plaque, Age if 65-74, or Male] Above score calculated as 2 points each if present [Age > 75, or Stroke/TIA/TE]  Failed Leonard Campbell on 08/15/15 Seen by Dr Leonard Campbell who thought he was in afib not flutter and  Since not symptomatic did not favor AAT or ablation  Has daughter and 60 yo grandson living with him Keeps him busy Likes to visit [place] where he is from occassionally    ROS: Denies fever, malais, weight loss, blurry vision, decreased visual acuity, cough, sputum, SOB, hemoptysis, pleuritic pain, palpitaitons, heartburn, abdominal pain, melena, lower extremity edema, claudication, or rash.  All other systems reviewed and negative  General: Affect appropriate Healthy:  appears stated age 34: normal Neck supple with no adenopathy JVP normal no bruits no thyromegaly Lungs clear with no wheezing and good diaphragmatic  motion Heart:  S1/S2 no murmur, no rub, gallop or click PMI normal Abdomen: benighn, BS positve, no tenderness, no AAA no bruit.  No HSM or HJR Distal pulses intact with no bruits No edema Neuro non-focal Skin warm and dry No muscular weakness   Current Outpatient Medications  Medication Sig Dispense Refill  . amLODipine (NORVASC) 10 MG tablet Take 10 mg by mouth daily.  3  . lisinopril-hydrochlorothiazide (PRINZIDE,ZESTORETIC) 20-25 MG tablet Take 1 tablet by mouth daily. 90 tablet 3  . XARELTO 20 MG TABS tablet TAKE 1 TABLET BY MOUTH ONCE DAILY WITH SUPPER 30 tablet 1   Current Facility-Administered Medications  Medication Dose Route Frequency Provider Last Rate Last Dose  . 0.9 %  sodium chloride infusion  500 mL Intravenous Continuous Leonard Banister, MD        Allergies  Patient has no known allergies.  Electrocardiogram: afib rate 58 PVC no acute changes   Assessment and Plan  HTN:  Well controlled.  Continue current medications and low sodium Dash type diet.   PVC;s  Asymptomatic  Normal stress echo 5/16 PAF:  Failed DCC and spontaneous conversion when seen by Dr Leonard Campbell on 10/09/15  His note indicated no AAT due to lack of symptoms currently in asymptomatic afib with good rate control  On xarelto no bleeding issues     f/U with me  In a year    Leonard Campbell

## 2017-10-04 ENCOUNTER — Encounter: Payer: Self-pay | Admitting: Cardiovascular Disease

## 2017-10-04 ENCOUNTER — Ambulatory Visit (INDEPENDENT_AMBULATORY_CARE_PROVIDER_SITE_OTHER): Payer: 59 | Admitting: Cardiovascular Disease

## 2017-10-04 VITALS — BP 138/78 | HR 60 | Ht 71.0 in | Wt 212.6 lb

## 2017-10-04 DIAGNOSIS — I482 Chronic atrial fibrillation, unspecified: Secondary | ICD-10-CM

## 2017-10-04 MED ORDER — LISINOPRIL-HYDROCHLOROTHIAZIDE 20-25 MG PO TABS: 1.0000 | ORAL_TABLET | Freq: Every day | ORAL | 3 refills | Status: DC

## 2017-10-04 MED ORDER — AMLODIPINE BESYLATE 10 MG PO TABS: 10.0000 mg | ORAL_TABLET | Freq: Every day | ORAL | 3 refills | Status: DC

## 2017-10-04 MED ORDER — RIVAROXABAN 20 MG PO TABS: 20.0000 mg | ORAL_TABLET | Freq: Every day | ORAL | 3 refills | Status: DC

## 2017-10-04 MED FILL — XARELTO 20 MG TABLET: 20 | 90 days supply | Qty: 90 | Fill #0

## 2017-10-04 NOTE — Patient Instructions (Signed)

## 2017-10-04 NOTE — Addendum Note (Signed)
Addended by: Aris Georgia, Adrick Kestler L on: 10/04/2017 10:28 AM   Modules accepted: Orders

## 2017-10-06 ENCOUNTER — Encounter: Payer: Self-pay | Admitting: Rheumatology

## 2017-10-06 ENCOUNTER — Ambulatory Visit: Payer: 59 | Admitting: Rheumatology

## 2017-10-06 VITALS — BP 129/85 | HR 51 | Resp 15 | Ht 72.0 in | Wt 213.4 lb

## 2017-10-06 DIAGNOSIS — M160 Bilateral primary osteoarthritis of hip: Secondary | ICD-10-CM

## 2017-10-06 DIAGNOSIS — Z86010 Personal history of colonic polyps: Secondary | ICD-10-CM

## 2017-10-06 DIAGNOSIS — M19012 Primary osteoarthritis, left shoulder: Secondary | ICD-10-CM

## 2017-10-06 DIAGNOSIS — I10 Essential (primary) hypertension: Secondary | ICD-10-CM

## 2017-10-06 DIAGNOSIS — Z8679 Personal history of other diseases of the circulatory system: Secondary | ICD-10-CM | POA: Diagnosis not present

## 2017-10-06 DIAGNOSIS — G8929 Other chronic pain: Secondary | ICD-10-CM | POA: Diagnosis not present

## 2017-10-06 DIAGNOSIS — R809 Proteinuria, unspecified: Secondary | ICD-10-CM | POA: Diagnosis not present

## 2017-10-06 DIAGNOSIS — M25511 Pain in right shoulder: Secondary | ICD-10-CM

## 2017-10-06 DIAGNOSIS — M791 Myalgia, unspecified site: Secondary | ICD-10-CM

## 2017-10-06 DIAGNOSIS — M19011 Primary osteoarthritis, right shoulder: Secondary | ICD-10-CM

## 2017-10-06 DIAGNOSIS — M25512 Pain in left shoulder: Secondary | ICD-10-CM | POA: Diagnosis not present

## 2017-10-07 DIAGNOSIS — R972 Elevated prostate specific antigen [PSA]: Secondary | ICD-10-CM | POA: Diagnosis not present

## 2017-10-10 LAB — CBC WITH DIFFERENTIAL/PLATELET
BASOS PCT: 0.3 %
Basophils Absolute: 21 cells/uL (ref 0–200)
EOS ABS: 119 {cells}/uL (ref 15–500)
EOS PCT: 1.7 %
HCT: 41.8 % (ref 38.5–50.0)
Hemoglobin: 14.4 g/dL (ref 13.2–17.1)
Lymphs Abs: 2933 cells/uL (ref 850–3900)
MCH: 35 pg — AB (ref 27.0–33.0)
MCHC: 34.4 g/dL (ref 32.0–36.0)
MCV: 101.7 fL — ABNORMAL HIGH (ref 80.0–100.0)
MONOS PCT: 9.1 %
MPV: 10.6 fL (ref 7.5–12.5)
Neutro Abs: 3290 cells/uL (ref 1500–7800)
Neutrophils Relative %: 47 %
PLATELETS: 298 10*3/uL (ref 140–400)
RBC: 4.11 10*6/uL — ABNORMAL LOW (ref 4.20–5.80)
RDW: 14.2 % (ref 11.0–15.0)
TOTAL LYMPHOCYTE: 41.9 %
WBC mixed population: 637 cells/uL (ref 200–950)
WBC: 7 10*3/uL (ref 3.8–10.8)

## 2017-10-10 LAB — ANA: Anti Nuclear Antibody(ANA): POSITIVE — AB

## 2017-10-10 LAB — COMPLETE METABOLIC PANEL WITH GFR
AG Ratio: 2.3 (calc) (ref 1.0–2.5)
ALKALINE PHOSPHATASE (APISO): 64 U/L (ref 40–115)
ALT: 15 U/L (ref 9–46)
AST: 13 U/L (ref 10–35)
Albumin: 4.2 g/dL (ref 3.6–5.1)
BILIRUBIN TOTAL: 0.4 mg/dL (ref 0.2–1.2)
BUN: 16 mg/dL (ref 7–25)
CHLORIDE: 103 mmol/L (ref 98–110)
CO2: 28 mmol/L (ref 20–32)
Calcium: 9.9 mg/dL (ref 8.6–10.3)
Creat: 1.01 mg/dL (ref 0.70–1.25)
GFR, Est African American: 89 mL/min/{1.73_m2} (ref >=60)
GFR, Est Non African American: 77 mL/min/{1.73_m2} (ref >=60)
GLUCOSE: 86 mg/dL (ref 65–99)
Globulin: 1.8 g/dL (calc) — ABNORMAL LOW (ref 1.9–3.7)
Potassium: 4.3 mmol/L (ref 3.5–5.3)
Sodium: 142 mmol/L (ref 135–146)
TOTAL PROTEIN: 6 g/dL — AB (ref 6.1–8.1)

## 2017-10-10 LAB — SEDIMENTATION RATE: SED RATE: 6 mm/h (ref 0–20)

## 2017-10-10 LAB — ANGIOTENSIN CONVERTING ENZYME: ANGIOTENSIN-CONVERTING ENZYME: 8 U/L — AB (ref 9–67)

## 2017-10-10 LAB — URIC ACID: URIC ACID, SERUM: 6.7 mg/dL (ref 4.0–8.0)

## 2017-10-10 LAB — CYCLIC CITRUL PEPTIDE ANTIBODY, IGG

## 2017-10-10 LAB — 14-3-3 ETA PROTEIN: 14-3-3 eta Protein: <0.2 ng/mL (ref <0.2)

## 2017-10-10 LAB — ANTI-NUCLEAR AB-TITER (ANA TITER): ANA Titer 1: 1:40 {titer} — ABNORMAL HIGH

## 2017-10-11 NOTE — Progress Notes (Signed)
I will discuss labs at the follow-up visit.

## 2017-10-25 DIAGNOSIS — M25512 Pain in left shoulder: Principal | ICD-10-CM

## 2017-10-25 DIAGNOSIS — M25511 Pain in right shoulder: Secondary | ICD-10-CM | POA: Insufficient documentation

## 2017-10-25 DIAGNOSIS — R809 Proteinuria, unspecified: Secondary | ICD-10-CM | POA: Insufficient documentation

## 2017-10-25 DIAGNOSIS — Z8679 Personal history of other diseases of the circulatory system: Secondary | ICD-10-CM | POA: Insufficient documentation

## 2017-10-25 DIAGNOSIS — M19012 Primary osteoarthritis, left shoulder: Secondary | ICD-10-CM

## 2017-10-25 DIAGNOSIS — M160 Bilateral primary osteoarthritis of hip: Secondary | ICD-10-CM | POA: Insufficient documentation

## 2017-10-25 DIAGNOSIS — Z86010 Personal history of colonic polyps: Secondary | ICD-10-CM | POA: Insufficient documentation

## 2017-10-25 DIAGNOSIS — M19011 Primary osteoarthritis, right shoulder: Secondary | ICD-10-CM | POA: Insufficient documentation

## 2017-10-25 NOTE — Progress Notes (Signed)
Office Visit Note  Patient: Leonard Campbell             Date of Birth: 02/17/1950           MRN: [id number]             PCP: Gaynelle Arabian, MD Referring: Gaynelle Arabian, MD Visit Date: 11/08/2017 Occupation: _0 @  Subjective:  Pain in right shoulder.   History of Present Illness: Leonard Campbell is a 67 y.o. male right frozen shoulder.  He had right shoulder joint cortisone injection about 6 7 weeks ago.  He states that shoulder joint is about 80% better.  He still has discomfort in the morning when he gets up.  None of the other joints are painful.  Activities of Daily Living:  Patient reports morning stiffness for 5-10 minutes.   Patient Denies nocturnal pain.  Difficulty dressing/grooming: Denies Difficulty climbing stairs: Denies Difficulty getting out of chair: Denies Difficulty using hands for taps, buttons, cutlery, and/or writing: Denies  Review of Systems  Constitutional: Negative for fatigue.  HENT: Negative for mouth sores, trouble swallowing, trouble swallowing and mouth dryness.   Eyes: Negative for pain and dryness.  Respiratory: Negative for shortness of breath and difficulty breathing.   Cardiovascular: Negative for chest pain and swelling in legs/feet.  Gastrointestinal: Negative for abdominal pain, constipation, diarrhea, nausea and vomiting.  Endocrine: Negative for increased urination.  Genitourinary: Negative for pelvic pain and urgency.  Musculoskeletal: Positive for arthralgias, joint pain and morning stiffness. Negative for joint swelling.  Skin: Negative for rash and hair loss.  Allergic/Immunologic: Negative for susceptible to infections.  Neurological: Negative for dizziness, light-headedness, headaches, memory loss and weakness.  Hematological: Negative for bruising/bleeding tendency.  Psychiatric/Behavioral: Negative for confusion.    PMFS History:  Patient Active Problem List   Diagnosis Date Noted  . Acute pain of both  shoulders 10/25/2017  . Bilateral acromioclavicular joint arthritis 10/25/2017  . Primary osteoarthritis of both hips 10/25/2017  . Proteinuria 10/25/2017  . History of atrial fibrillation 10/25/2017  . History of colon polyps 10/25/2017  . ANKLE PAIN, RIGHT 02/18/2010  . SINUS TARSI SYNDROME, RIGHT FOOT 02/18/2010  . Essential hypertension 05/09/2008  . PAROXYSMAL ATRIAL FIBRILLATION 05/09/2008  . ACHILLES BURSITIS OR TENDINITIS 12/02/2007  . HEEL PAIN, RIGHT 12/02/2007  . ACHILLES TENDON TEAR 12/02/2007    Past Medical History:  Diagnosis Date  . Achilles bursitis or tendinitis   . ACHILLES TENDON TEAR   . ANKLE PAIN, RIGHT   . Complication of anesthesia    None per pt!  Marland Kitchen HEEL PAIN, RIGHT   . Hemorrhoids   . HYPERTENSION   . PAROXYSMAL ATRIAL FIBRILLATION   . SINUS TARSI SYNDROME, RIGHT FOOT     Family History  Problem Relation Age of Onset  . Heart failure Mother        CHF  . Hypertension Mother   . Kidney disease Mother   . Hypertension Brother   . Congestive Heart Failure Brother   . Breast cancer Maternal Aunt   . Lung cancer Maternal Aunt   . Healthy Daughter   . Healthy Daughter    Past Surgical History:  Procedure Laterality Date  . CARDIOVERSION N/A 08/15/2015   Procedure: CARDIOVERSION;  Surgeon: Josue Hector, MD;  Location[facility] ENDOSCOPY;  Service: Cardiovascular;  Laterality: N/A;  . MASS EXCISION Right 10/22/2015   Procedure: EXCISION MASS RIGHT THUMB;  Surgeon: Daryll Brod, MD;  Location: [place];  Service: Orthopedics;  Laterality: Right;  . TONSILLECTOMY     Social History   Social History Narrative  . Not on file    Objective: Vital Signs: BP 139/86 (BP Location: Left Arm, Patient Position: Sitting, Cuff Size: Normal)   Pulse 63   Resp 14   Ht 6' (1.829 m)   Wt 213 lb (96.6 kg)   BMI 28.89 kg/m    Physical Exam  Constitutional: He is oriented to person, place, and time. He appears well-developed and well-nourished.    HENT:  Head: Normocephalic and atraumatic.  Eyes: Pupils are equal, round, and reactive to light. Conjunctivae and EOM are normal.  Neck: Normal range of motion. Neck supple.  Cardiovascular: Normal rate, regular rhythm and normal heart sounds.  Pulmonary/Chest: Effort normal and breath sounds normal.  Abdominal: Soft. Bowel sounds are normal.  Neurological: He is alert and oriented to person, place, and time.  Skin: Skin is warm and dry. Capillary refill takes less than 2 seconds.  Psychiatric: He has a normal mood and affect. His behavior is normal.  Nursing note and vitals reviewed.    Musculoskeletal Exam: C-spine thoracic lumbar spine good range of motion.  Right shoulder joint was in good range of motion with some discomfort.  Elbow joints wrist joint MCPs PIPs DIPs were in good range of motion with no synovitis.  Hip joints knee joints ankles MTPs PIPs were in good range of motion with no synovitis.  CDAI Exam: CDAI Score: Not documented Patient Global Assessment: Not documented; Provider Global Assessment: Not documented Swollen: Not documented; Tender: Not documented Joint Exam   Not documented   There is currently no information documented on the homunculus. Go to the Rheumatology activity and complete the homunculus joint exam.  Investigation: No additional findings.  Imaging: No results found.  Recent Labs: Lab Results  Component Value Date   WBC 7.0 10/06/2017   HGB 14.4 10/06/2017   PLT 298 10/06/2017   NA 142 10/06/2017   K 4.3 10/06/2017   CL 103 10/06/2017   CO2 28 10/06/2017   GLUCOSE 86 10/06/2017   BUN 16 10/06/2017   CREATININE 1.01 10/06/2017   BILITOT 0.4 10/06/2017   AST 13 10/06/2017   ALT 15 10/06/2017   PROT 6.0 (L) 10/06/2017   CALCIUM 9.9 10/06/2017   GFRAA 89 10/06/2017  ANA 1: 40 homogeneous, ESR 6, anti-CCP negative, _0 eta negative, ACE negative, uric acid 6.7  Speciality Comments: No specialty comments  available.  Procedures:  No procedures performed Allergies: Patient has no known allergies.   Assessment / Plan:     Visit Diagnoses: Acute pain of both shoulders - Most likely frozen shoulder.  Status post cortisone injections with good response.  All autoimmune work-up is negative.  Patient symptoms have  improved.  Although he still has some discomfort.  He will benefit from physical therapy.  He states he has a prescription for that and he will go for PT.  I have given him a handout on shoulder joint exercises.  A list of natural anti-inflammatory was discussed.  Bilateral acromioclavicular joint arthritis  Primary osteoarthritis of both hips-he is currently not having any discomfort.  Weight loss and regular exercise was discussed.  Essential hypertension-his blood pressure is mildly elevated.  History of atrial fibrillation  History of colon polyps  Proteinuria, unspecified type   Orders: No orders of the defined types were placed in this encounter.  No orders of the defined types were placed in this encounter.  Follow-Up Instructions: Return if symptoms worsen or fail to improve, for Osteoarthritis.   Bo Merino, MD  Note - This record has been created using Editor, commissioning.  Chart creation errors have been sought, but may not always  have been located. Such creation errors do not reflect on  the standard of medical care.

## 2017-11-03 DIAGNOSIS — M25511 Pain in right shoulder: Secondary | ICD-10-CM | POA: Diagnosis not present

## 2017-11-08 ENCOUNTER — Ambulatory Visit: Payer: 59 | Admitting: Rheumatology

## 2017-11-08 ENCOUNTER — Encounter: Payer: Self-pay | Admitting: Rheumatology

## 2017-11-08 VITALS — BP 139/86 | HR 63 | Resp 14 | Ht 72.0 in | Wt 213.0 lb

## 2017-11-08 DIAGNOSIS — M19011 Primary osteoarthritis, right shoulder: Secondary | ICD-10-CM

## 2017-11-08 DIAGNOSIS — M25512 Pain in left shoulder: Secondary | ICD-10-CM | POA: Diagnosis not present

## 2017-11-08 DIAGNOSIS — I10 Essential (primary) hypertension: Secondary | ICD-10-CM | POA: Diagnosis not present

## 2017-11-08 DIAGNOSIS — R809 Proteinuria, unspecified: Secondary | ICD-10-CM

## 2017-11-08 DIAGNOSIS — M19012 Primary osteoarthritis, left shoulder: Secondary | ICD-10-CM

## 2017-11-08 DIAGNOSIS — Z86010 Personal history of colonic polyps: Secondary | ICD-10-CM

## 2017-11-08 DIAGNOSIS — M25511 Pain in right shoulder: Secondary | ICD-10-CM | POA: Diagnosis not present

## 2017-11-08 DIAGNOSIS — Z8679 Personal history of other diseases of the circulatory system: Secondary | ICD-10-CM | POA: Diagnosis not present

## 2017-11-08 DIAGNOSIS — M160 Bilateral primary osteoarthritis of hip: Secondary | ICD-10-CM | POA: Diagnosis not present

## 2017-11-08 MED FILL — AMLODIPINE BESYLATE 10 MG T: 10 | 90 days supply | Qty: 90 | Fill #2

## 2017-11-08 NOTE — Patient Instructions (Signed)
Shoulder Exercises Ask your health care provider which exercises are safe for you. Do exercises exactly as told by your health care provider and adjust them as directed. It is normal to feel mild stretching, pulling, tightness, or discomfort as you do these exercises, but you should stop right away if you feel sudden pain or your pain gets worse.Do not begin these exercises until told by your health care provider. RANGE OF MOTION EXERCISES These exercises warm up your muscles and joints and improve the movement and flexibility of your shoulder. These exercises also help to relieve pain, numbness, and tingling. These exercises involve stretching your injured shoulder directly. Exercise A: Pendulum  1. Stand near a wall or a surface that you can hold onto for balance. 2. Bend at the waist and let your left / right arm hang straight down. Use your other arm to support you. Keep your back straight and do not lock your knees. 3. Relax your left / right arm and shoulder muscles, and move your hips and your trunk so your left / right arm swings freely. Your arm should swing because of the motion of your body, not because you are using your arm or shoulder muscles. 4. Keep moving your body so your arm swings in the following directions, as told by your health care provider: ? Side to side. ? Forward and backward. ? In clockwise and counterclockwise circles. 5. Continue each motion for __________ seconds, or for as long as told by your health care provider. 6. Slowly return to the starting position. Repeat __________ times. Complete this exercise __________ times a day. Exercise B:Flexion, Standing  1. Stand and hold a broomstick, a cane, or a similar object. Place your hands a little more than shoulder-width apart on the object. Your left / right hand should be palm-up, and your other hand should be palm-down. 2. Keep your elbow straight and keep your shoulder muscles relaxed. Push the stick down with  your healthy arm to raise your left / right arm in front of your body, and then over your head until you feel a stretch in your shoulder. ? Avoid shrugging your shoulder while you raise your arm. Keep your shoulder blade tucked down toward the middle of your back. 3. Hold for __________ seconds. 4. Slowly return to the starting position. Repeat __________ times. Complete this exercise __________ times a day. Exercise C: Abduction, Standing 1. Stand and hold a broomstick, a cane, or a similar object. Place your hands a little more than shoulder-width apart on the object. Your left / right hand should be palm-up, and your other hand should be palm-down. 2. While keeping your elbow straight and your shoulder muscles relaxed, push the stick across your body toward your left / right side. Raise your left / right arm to the side of your body and then over your head until you feel a stretch in your shoulder. ? Do not raise your arm above shoulder height, unless your health care provider tells you to do that. ? Avoid shrugging your shoulder while you raise your arm. Keep your shoulder blade tucked down toward the middle of your back. 3. Hold for __________ seconds. 4. Slowly return to the starting position. Repeat __________ times. Complete this exercise __________ times a day. Exercise D:Internal Rotation  1. Place your left / right hand behind your back, palm-up. 2. Use your other hand to dangle an exercise band, a towel, or a similar object over your shoulder. Grasp the band with   your left / right hand so you are holding onto both ends. 3. Gently pull up on the band until you feel a stretch in the front of your left / right shoulder. ? Avoid shrugging your shoulder while you raise your arm. Keep your shoulder blade tucked down toward the middle of your back. 4. Hold for __________ seconds. 5. Release the stretch by letting go of the band and lowering your hands. Repeat __________ times. Complete  this exercise __________ times a day. STRETCHING EXERCISES These exercises warm up your muscles and joints and improve the movement and flexibility of your shoulder. These exercises also help to relieve pain, numbness, and tingling. These exercises are done using your healthy shoulder to help stretch the muscles of your injured shoulder. Exercise E: Corner Stretch (External Rotation and Abduction)  1. Stand in a doorway with one of your feet slightly in front of the other. This is called a staggered stance. If you cannot reach your forearms to the door frame, stand facing a corner of a room. 2. Choose one of the following positions as told by your health care provider: ? Place your hands and forearms on the door frame above your head. ? Place your hands and forearms on the door frame at the height of your head. ? Place your hands on the door frame at the height of your elbows. 3. Slowly move your weight onto your front foot until you feel a stretch across your chest and in the front of your shoulders. Keep your head and chest upright and keep your abdominal muscles tight. 4. Hold for __________ seconds. 5. To release the stretch, shift your weight to your back foot. Repeat __________ times. Complete this stretch __________ times a day. Exercise F:Extension, Standing 1. Stand and hold a broomstick, a cane, or a similar object behind your back. ? Your hands should be a little wider than shoulder-width apart. ? Your palms should face away from your back. 2. Keeping your elbows straight and keeping your shoulder muscles relaxed, move the stick away from your body until you feel a stretch in your shoulder. ? Avoid shrugging your shoulders while you move the stick. Keep your shoulder blade tucked down toward the middle of your back. 3. Hold for __________ seconds. 4. Slowly return to the starting position. Repeat __________ times. Complete this exercise __________ times a day. STRENGTHENING  EXERCISES These exercises build strength and endurance in your shoulder. Endurance is the ability to use your muscles for a long time, even after they get tired. Exercise G:External Rotation  1. Sit in a stable chair without armrests. 2. Secure an exercise band at elbow height on your left / right side. 3. Place a soft object, such as a folded towel or a small pillow, between your left / right upper arm and your body to move your elbow a few inches away (about 10 cm) from your side. 4. Hold the end of the band so it is tight and there is no slack. 5. Keeping your elbow pressed against the soft object, move your left / right forearm out, away from your abdomen. Keep your body steady so only your forearm moves. 6. Hold for __________ seconds. 7. Slowly return to the starting position. Repeat __________ times. Complete this exercise __________ times a day. Exercise H:Shoulder Abduction  1. Sit in a stable chair without armrests, or stand. 2. Hold a __________ weight in your left / right hand, or hold an exercise band with both hands.   3. Start with your arms straight down and your left / right palm facing in, toward your body. 4. Slowly lift your left / right hand out to your side. Do not lift your hand above shoulder height unless your health care provider tells you that this is safe. ? Keep your arms straight. ? Avoid shrugging your shoulder while you do this movement. Keep your shoulder blade tucked down toward the middle of your back. 5. Hold for __________ seconds. 6. Slowly lower your arm, and return to the starting position. Repeat __________ times. Complete this exercise __________ times a day. Exercise I:Shoulder Extension 1. Sit in a stable chair without armrests, or stand. 2. Secure an exercise band to a stable object in front of you where it is at shoulder height. 3. Hold one end of the exercise band in each hand. Your palms should face each other. 4. Straighten your elbows and  lift your hands up to shoulder height. 5. Step back, away from the secured end of the exercise band, until the band is tight and there is no slack. 6. Squeeze your shoulder blades together as you pull your hands down to the sides of your thighs. Stop when your hands are straight down by your sides. Do not let your hands go behind your body. 7. Hold for __________ seconds. 8. Slowly return to the starting position. Repeat __________ times. Complete this exercise __________ times a day. Exercise J:Standing Shoulder Row 1. Sit in a stable chair without armrests, or stand. 2. Secure an exercise band to a stable object in front of you so it is at waist height. 3. Hold one end of the exercise band in each hand. Your palms should be in a thumbs-up position. 4. Bend each of your elbows to an "L" shape (about 90 degrees) and keep your upper arms at your sides. 5. Step back until the band is tight and there is no slack. 6. Slowly pull your elbows back behind you. 7. Hold for __________ seconds. 8. Slowly return to the starting position. Repeat __________ times. Complete this exercise __________ times a day. Exercise K:Shoulder Press-Ups  1. Sit in a stable chair that has armrests. Sit upright, with your feet flat on the floor. 2. Put your hands on the armrests so your elbows are bent and your fingers are pointing forward. Your hands should be about even with the sides of your body. 3. Push down on the armrests and use your arms to lift yourself off of the chair. Straighten your elbows and lift yourself up as much as you comfortably can. ? Move your shoulder blades down, and avoid letting your shoulders move up toward your ears. ? Keep your feet on the ground. As you get stronger, your feet should support less of your body weight as you lift yourself up. 4. Hold for __________ seconds. 5. Slowly lower yourself back into the chair. Repeat __________ times. Complete this exercise __________ times a  day. Exercise L: Wall Push-Ups  1. Stand so you are facing a stable wall. Your feet should be about one arm-length away from the wall. 2. Lean forward and place your palms on the wall at shoulder height. 3. Keep your feet flat on the floor as you bend your elbows and lean forward toward the wall. 4. Hold for __________ seconds. 5. Straighten your elbows to push yourself back to the starting position. Repeat __________ times. Complete this exercise __________ times a day. This information is not intended to replace advice   given to you by your health care provider. Make sure you discuss any questions you have with your health care provider. Document Released: 12/17/2004 Document Revised: 10/28/2015 Document Reviewed: 10/14/2014 Elsevier Interactive Patient Education  2018 [employer]. Natural anti-inflammatories  You can purchase these at [employer], [employer] or online.  . Turmeric (capsules)  . Ginger (ginger root or capsules)  . Omega 3 (Fish, flax seeds, chia seeds, walnuts, almonds)  . Tart cherry (dried or extract)   Patient should be under the care of a physician while taking these supplements. This may not be reproduced without the permission of Dr. Bo Merino.

## 2017-11-09 ENCOUNTER — Other Ambulatory Visit: Payer: Self-pay

## 2017-11-09 ENCOUNTER — Encounter: Payer: Self-pay | Admitting: Physical Therapy

## 2017-11-09 ENCOUNTER — Ambulatory Visit: Payer: 59 | Attending: Orthopaedic Surgery | Admitting: Physical Therapy

## 2017-11-09 DIAGNOSIS — M6281 Muscle weakness (generalized): Secondary | ICD-10-CM | POA: Insufficient documentation

## 2017-11-09 DIAGNOSIS — M25511 Pain in right shoulder: Secondary | ICD-10-CM | POA: Diagnosis not present

## 2017-11-09 DIAGNOSIS — M25611 Stiffness of right shoulder, not elsewhere classified: Secondary | ICD-10-CM | POA: Insufficient documentation

## 2017-11-09 DIAGNOSIS — G8929 Other chronic pain: Secondary | ICD-10-CM | POA: Diagnosis not present

## 2017-11-10 NOTE — Therapy (Signed)
[place], [place], [address] Phone: [phone]   Fax:  [phone]  Physical Therapy Evaluation  Patient Details  Name: Leonard Campbell MRN: [id number] Date of Birth: Jul 13, 1950 Referring Provider: Dr Melrose Nakayama   Encounter Date: 11/09/2017  PT End of Session - 11/10/17 0556    Visit Number  1    Number of Visits  12    Date for PT Re-Evaluation  12/21/17    PT Start Time  1332    PT Stop Time  1412    PT Time Calculation (min)  40 min    Activity Tolerance  Patient tolerated treatment well    Behavior During Therapy  [facility] for tasks assessed/performed       Past Medical History:  Diagnosis Date  . Achilles bursitis or tendinitis   . ACHILLES TENDON TEAR   . ANKLE PAIN, RIGHT   . Complication of anesthesia    None per pt!  Marland Kitchen HEEL PAIN, RIGHT   . Hemorrhoids   . HYPERTENSION   . PAROXYSMAL ATRIAL FIBRILLATION   . SINUS TARSI SYNDROME, RIGHT FOOT     Past Surgical History:  Procedure Laterality Date  . CARDIOVERSION N/A 08/15/2015   Procedure: CARDIOVERSION;  Surgeon: Josue Hector, MD;  Location: [facility] ENDOSCOPY;  Service: Cardiovascular;  Laterality: N/A;  . MASS EXCISION Right 10/22/2015   Procedure: EXCISION MASS RIGHT THUMB;  Surgeon: Daryll Brod, MD;  Location: [facility];  Service: Orthopedics;  Laterality: Right;  . TONSILLECTOMY      There were no vitals filed for this visit.   Subjective Assessment - 11/09/17 1335    Subjective  Pt presetns with chief complaint of Rt shoulder pain.  He did have bilateral shoudler pain iniitially which began in april. At that time he was nearly unable to get out of bed due to severe pain.  He received an injection which helped mainly the L side.  He has diffiuculty in AM hours, stiffness, sleeping.  Both shoulders areas are stiff, no complaint of weakness or sensory disturbance.      Pertinent History  Rt achilles tear, OA , HTN    Limitations  Lifting;House hold activities;Other (comment)    Diagnostic tests  XR showed mild to mod OA in [facility] joint (07/2017)     Patient Stated Goals  patient would like to sleep throughout the night without pain     Currently in Pain?  Yes    Pain Score  2    barely any at rest    Pain Location  Shoulder    Pain Orientation  Right    Pain Descriptors / Indicators  Tightness;Sharp    Pain Type  Chronic pain    Pain Radiating Towards  bilateral arms     Pain Onset  More than a month ago    Pain Frequency  Intermittent    Aggravating Factors   AM hours, reaching back , lifting with Rt shoudler     Pain Relieving Factors  exercises (given by MD) , walks with treadmill with weights , Tylenol     Effect of Pain on Daily Activities  limits activity          [facility] PT Assessment - 11/10/17 0001      Assessment   Medical Diagnosis  Rt shoulder     Referring Provider  Dr Melrose Nakayama    Onset Date/Surgical Date  --   April 2019  Hand Dominance  Right    Prior Therapy  Yes for R ankle       Precautions   Precautions  None      Restrictions   Weight Bearing Restrictions  No      Balance Screen   Has the patient fallen in the past 6 months  No      [place] residence    Living Arrangements  Spouse/significant other    Type of [place] Access  Level entry    [place]  Two level    Alternate Level Stairs-Number of Steps  12    Alternate Level Stairs-Rails  Right      Prior Function   Level of Independence  Independent    Vocation  Retired    [employer]  was [employer]    Leisure  bowling, travel      Cognition   Overall Cognitive Status  Within Functional Limits for tasks assessed      Observation/Other Assessments   Focus on Therapeutic Outcomes (FOTO)   37%      Sensation   Light Touch  Appears Intact      Coordination   Gross Motor Movements are Fluid and Coordinated  Not tested      Posture/Postural  Control   Posture/Postural Control  Postural limitations    Postural Limitations  Rounded Shoulders;Forward head      AROM   Right Shoulder Flexion  145 Degrees   increased time    Right Shoulder ABduction  140 Degrees   slow , painful arc    Right Shoulder Internal Rotation  --   FR to sacrum    Right Shoulder External Rotation  --   FR to T2    Left Shoulder Flexion  140 Degrees    Left Shoulder ABduction  138 Degrees    Left Shoulder Internal Rotation  65 Degrees   FR to T8   Left Shoulder External Rotation  28 Degrees   FR to T2      PROM   Overall PROM Comments  pain end range flexion and combined ER/Abd      Strength   Right Shoulder Flexion  3+/5   pain inhibition   Right Shoulder ABduction  3/5    Right Shoulder Internal Rotation  4+/5    Right Shoulder External Rotation  4-/5    Left Shoulder Flexion  4+/5    Left Shoulder ABduction  3+/5    Left Shoulder Internal Rotation  5/5    Left Shoulder External Rotation  4/5    Right Elbow Flexion  5/5    Right Elbow Extension  5/5    Left Elbow Flexion  5/5    Left Elbow Extension  5/5    Right Hand Grip (lbs)  86.7 lbs    Left Hand Grip (lbs)  54.5 lbs      Palpation   Palpation comment  non painful with palpation to bilateral UEs                 Objective measurements completed on examination: See above findings.      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             PT Education - 11/10/17 0555    Education Details  PT/POC , HEP, RICE     Person(s) Educated  Patient    Methods  Explanation;Handout    Comprehension  Verbalized understanding;Returned demonstration          PT Long Term Goals - 11/10/17 0556      PT LONG TERM GOAL #1   Title  Pt will be I for bilateral UE AROM, strength and posture     Time  6    Period  Weeks    Status  New    Target Date  12/21/17      PT LONG TERM GOAL #2   Title  Pt will be able to report 25% less stiffness in Rt UE pain after waking in the AM (even before stretches)     Time  6    Period  Weeks    Status  New    Target Date  12/21/17      PT LONG TERM GOAL #3   Title  FOTO score will improve to <26% impaired to demo improvement in functional mobility.     Baseline  37%      PT LONG TERM GOAL #4   Title  Pt will be able to return to PLOF for recreational bowling and yardwork without limitation of pain.     Time  6    Period  Weeks    Status  New    Target Date  12/21/17      PT LONG TERM GOAL #5   Title  Pt will be able to reach with Rt UE behind his back without increased pain for hygiene, dressing     Time  6    Period  Weeks    Status  New    Target Date  12/21/17             Plan - 11/10/17 1252    Clinical Impression Statement  Patient presents for low complexity eval of Rt UE pain and stiffness related to adhesive capsulitis.  He has limitations in AROM (ER>Abd>IR).  He reports improvement thus far but still has difficuly in AM hours.  He has pain with recreation, yardwork and normal functional activities in and out of the home.  He has weakness but with pain inhibition.      Clinical Presentation  Stable    Clinical Decision Making  Low    Rehab Potential  Excellent    PT Frequency  2x / week    PT Duration  6 weeks    PT Treatment/Interventions  ADLs/Self Care Home Management;Electrical Stimulation;Functional mobility training;Neuromuscular re-education;Iontophoresis 4mg /ml Dexamethasone;Moist Heat;Cryotherapy;Ultrasound;Therapeutic  exercise;Patient/family education;Dry needling;Manual techniques;Passive range of motion;Taping;Therapeutic activities    PT Next Visit Plan  check HEP, manual, strength /AROM , ice vs heat     PT Home Exercise Plan  wall stretches, ER red band unattached    Consulted and Agree with Plan of Care  Patient       Patient will benefit from skilled therapeutic intervention in order to improve the following deficits and impairments:  Pain, Postural dysfunction, Impaired UE functional  use, Increased fascial restricitons, Impaired flexibility, Decreased strength, Decreased range of motion, Hypomobility  Visit Diagnosis: Chronic right shoulder pain  Stiffness of right shoulder, not elsewhere classified  Muscle weakness (generalized)     Problem List Patient Active Problem List   Diagnosis Date Noted  . Acute pain of both shoulders 10/25/2017  . Bilateral acromioclavicular joint arthritis 10/25/2017  . Primary osteoarthritis of both hips 10/25/2017  . Proteinuria 10/25/2017  . History of atrial fibrillation 10/25/2017  . History of colon polyps 10/25/2017  . ANKLE PAIN, RIGHT 02/18/2010  . SINUS TARSI SYNDROME, RIGHT FOOT 02/18/2010  . Essential hypertension 05/09/2008  . PAROXYSMAL ATRIAL FIBRILLATION 05/09/2008  . ACHILLES BURSITIS OR TENDINITIS 12/02/2007  . HEEL PAIN, RIGHT 12/02/2007  . ACHILLES TENDON TEAR 12/02/2007    Leonard Campbell 11/10/2017, 12:59 PM  [facility] [address] [place], [place], [address] Phone: [phone]   Fax:  [phone]  Name: Leonard Campbell MRN: [id number] Date of Birth: [birth year]   Raeford Razor, PT 11/10/17 12:59 PM Phone: [phone] Fax: [phone]

## 2017-11-16 ENCOUNTER — Ambulatory Visit: Payer: 59 | Attending: Orthopaedic Surgery | Admitting: Physical Therapy

## 2017-11-16 ENCOUNTER — Encounter: Payer: Self-pay | Admitting: Physical Therapy

## 2017-11-16 DIAGNOSIS — M6281 Muscle weakness (generalized): Secondary | ICD-10-CM | POA: Diagnosis not present

## 2017-11-16 DIAGNOSIS — M79661 Pain in right lower leg: Secondary | ICD-10-CM | POA: Diagnosis not present

## 2017-11-16 DIAGNOSIS — G8929 Other chronic pain: Secondary | ICD-10-CM | POA: Diagnosis not present

## 2017-11-16 DIAGNOSIS — M79671 Pain in right foot: Secondary | ICD-10-CM | POA: Insufficient documentation

## 2017-11-16 DIAGNOSIS — M25611 Stiffness of right shoulder, not elsewhere classified: Secondary | ICD-10-CM | POA: Insufficient documentation

## 2017-11-16 DIAGNOSIS — M25511 Pain in right shoulder: Secondary | ICD-10-CM | POA: Insufficient documentation

## 2017-11-16 NOTE — Patient Instructions (Signed)
Posterior Capsule Sleeper Stretch, Side-Lying    Lie on side, pillow under head, neck in neutral, underside arm in 90-90 of shoulder and elbow flexion with scapula fixed to table. Use other hand to press back of underside arm forward and downward. Keep elbow angle. Hold _30__ seconds.  Repeat _3-5__ times per session. Do _1-2__ sessions per day.   Copyright  VHI. All rights reserved.

## 2017-11-16 NOTE — Therapy (Signed)
[place], [place], [address] Phone: [phone]   Fax:  [phone]  Physical Therapy Treatment  Patient Details  Name: Leonard Campbell MRN: [id number] Date of Birth: [birth year] Referring Provider (PT): Dr Melrose Nakayama   Encounter Date: 11/16/2017  PT End of Session - 11/16/17 1501    Visit Number  2    Number of Visits  12    Date for PT Re-Evaluation  12/21/17    PT Start Time  3664    PT Stop Time  1512    PT Time Calculation (min)  55 min    Activity Tolerance  Patient tolerated treatment well    Behavior During Therapy  [facility] for tasks assessed/performed       Past Medical History:  Diagnosis Date  . Achilles bursitis or tendinitis   . ACHILLES TENDON TEAR   . ANKLE PAIN, RIGHT   . Complication of anesthesia    None per pt!  Marland Kitchen HEEL PAIN, RIGHT   . Hemorrhoids   . HYPERTENSION   . PAROXYSMAL ATRIAL FIBRILLATION   . SINUS TARSI SYNDROME, RIGHT FOOT     Past Surgical History:  Procedure Laterality Date  . CARDIOVERSION N/A 08/15/2015   Procedure: CARDIOVERSION;  Surgeon: Josue Hector, MD;  [facility];  Service: Cardiovascular;  Laterality: N/A;  . MASS EXCISION Right 10/22/2015   Procedure: EXCISION MASS RIGHT THUMB;  Surgeon: Daryll Brod, MD;  Location: [place];  Service: Orthopedics;  Laterality: Right;  . TONSILLECTOMY      There were no vitals filed for this visit.  Subjective Assessment - 11/16/17 1418    Subjective  Stiff,  I have been doing the exercise.  It is too soon to know if things are better.      Currently in Pain?  Yes    Pain Score  5    no pain at rest stiff only.   Pain Location  Shoulder    Pain Orientation  Posterior;Right    Pain Descriptors / Indicators  Tightness;Sharp    Pain Type  Chronic pain    Pain Radiating Towards  neck , upper traps    Pain Onset  More than a month ago    Pain Frequency  Intermittent    Aggravating  Factors   am,  reaching back,  quick movements    Pain Relieving Factors  change position. ice,  walks with barbells with elbows bent with trunk rotation    Effect of Pain on Daily Activities  limity activity,  sleeping                       [facility] Adult PT Treatment/Exercise - 11/16/17 0001      Shoulder Exercises: Supine   External Rotation  AROM   fists on forehead     Shoulder Exercises: Standing   Internal Rotation  10 reps;AAROM   cane,  no pain   Extension  10 reps;AAROM   cane    Other Standing Exercises  with red ball bowling simulation 10 X to trampoline  PTA caught       Shoulder Exercises: Pulleys   Flexion  3 minutes      Shoulder Exercises: Stretch   Corner Stretch  3 reps   cues   Corner Stretch Limitations  also single arm doorway stretch 3 X 30 cued initially    Wall Stretch - Flexion  --   10  reps with towel,  both   Table Stretch - Flexion  3 reps    Table Stretch - Abduction  3 reps    Table Stretch - External Rotation  3 reps   also scaption   Other Shoulder Stretches  sleeper stretch    Other Shoulder Stretches  self mobs with rolle d towel for IR      Modalities   Modalities  Cryotherapy      Cryotherapy   Cryotherapy Location  Shoulder    Type of Cryotherapy  --   cold pack both            PT Education - 11/16/17 1445    Education Details  HEP    Person(s) Educated  Patient    Methods  Explanation;Demonstration;Tactile cues;Verbal cues;Handout    Comprehension  Verbalized understanding;Returned demonstration          PT Long Term Goals - 11/16/17 1447      PT LONG TERM GOAL #1   Title  Pt will be I for bilateral UE AROM, strength and posture     Baseline  still building HEP    Time  6    Period  Weeks    Status  On-going      PT LONG TERM GOAL #2   Title  Pt will be able to report 25% less stiffness in Rt UE pain after waking in the AM (even before stretches)     Baseline  still stiff in am.    Time  6     Period  Weeks    Status  On-going      PT LONG TERM GOAL #3   Title  FOTO score will improve to <26% impaired to demo improvement in functional mobility.     Time  4    Period  Weeks    Status  Unable to assess      PT LONG TERM GOAL #4   Title  Pt will be able to return to PLOF for recreational bowling and yardwork without limitation of pain.     Baseline  back to some things,  bowling,  not good scoring yet    Time  6    Period  Weeks    Status  On-going      PT LONG TERM GOAL #5   Title  Pt will be able to reach with Rt UE behind his back without increased pain for hygiene, dressing     Baseline  painful    Time  6    Period  Weeks            Plan - 11/16/17 1804    Clinical Impression Statement  Patient is making gains with IR requiring less time to reach and increased range.  He was able to reach  SI post session.  Shoulder sore post session 4/10 however shoulder felt better less restricted.   AROM 135 flexion.     PT Next Visit Plan  continue self mobs, manual, check sleeper stretch  work toward goals.      PT Home Exercise Plan  wall stretches, ER red band unattached,  self mob with rolled towel,  sleeper stretch posterior capsule.    Consulted and Agree with Plan of Care  Patient       Patient will benefit from skilled therapeutic intervention in order to improve the following deficits and impairments:     Visit Diagnosis: Chronic right shoulder pain  Stiffness of right shoulder,  not elsewhere classified  Muscle weakness (generalized)  Pain in right lower leg  Pain of right heel     Problem List Patient Active Problem List   Diagnosis Date Noted  . Acute pain of both shoulders 10/25/2017  . Bilateral acromioclavicular joint arthritis 10/25/2017  . Primary osteoarthritis of both hips 10/25/2017  . Proteinuria 10/25/2017  . History of atrial fibrillation 10/25/2017  . History of colon polyps 10/25/2017  . ANKLE PAIN, RIGHT 02/18/2010  . SINUS TARSI  SYNDROME, RIGHT FOOT 02/18/2010  . Essential hypertension 05/09/2008  . PAROXYSMAL ATRIAL FIBRILLATION 05/09/2008  . ACHILLES BURSITIS OR TENDINITIS 12/02/2007  . HEEL PAIN, RIGHT 12/02/2007  . ACHILLES TENDON TEAR 12/02/2007    Bennie Chirico PTA 11/16/2017, 6:10 PM  [facility] [address] [place], [place], [address] Phone: [phone]   Fax:  [phone]  Name: TYVION EDMONDSON MRN: [id number] Date of Birth: 1950/06/24

## 2017-11-23 ENCOUNTER — Encounter: Payer: Self-pay | Admitting: Physical Therapy

## 2017-11-23 ENCOUNTER — Ambulatory Visit: Payer: 59 | Admitting: Physical Therapy

## 2017-11-23 DIAGNOSIS — M25611 Stiffness of right shoulder, not elsewhere classified: Secondary | ICD-10-CM | POA: Diagnosis not present

## 2017-11-23 DIAGNOSIS — G8929 Other chronic pain: Secondary | ICD-10-CM | POA: Diagnosis not present

## 2017-11-23 DIAGNOSIS — M25511 Pain in right shoulder: Principal | ICD-10-CM

## 2017-11-23 DIAGNOSIS — M6281 Muscle weakness (generalized): Secondary | ICD-10-CM | POA: Diagnosis not present

## 2017-11-23 DIAGNOSIS — M79671 Pain in right foot: Secondary | ICD-10-CM | POA: Diagnosis not present

## 2017-11-23 DIAGNOSIS — M79661 Pain in right lower leg: Secondary | ICD-10-CM | POA: Diagnosis not present

## 2017-11-23 NOTE — Therapy (Signed)
Leonard Campbell, [place], [address] Phone: [phone]   Fax:  [phone]  Physical Therapy Treatment  Patient Details  Name: Leonard Campbell MRN: [id number] Date of Birth: 1950/08/26 Referring Provider (PT): Leonard Campbell   Encounter Date: 11/23/2017  PT End of Session - 11/23/17 1013    Visit Number  3    Number of Visits  12    Date for PT Re-Evaluation  12/21/17    PT Start Time  0932    PT Stop Time  1023    PT Time Calculation (min)  51 min    Activity Tolerance  Patient tolerated treatment well    Behavior During Therapy  [facility] for tasks assessed/performed       Past Medical History:  Diagnosis Date  . Achilles bursitis or tendinitis   . ACHILLES TENDON TEAR   . ANKLE PAIN, RIGHT   . Complication of anesthesia    None per pt!  Marland Kitchen HEEL PAIN, RIGHT   . Hemorrhoids   . HYPERTENSION   . PAROXYSMAL ATRIAL FIBRILLATION   . SINUS TARSI SYNDROME, RIGHT FOOT     Past Surgical History:  Procedure Laterality Date  . CARDIOVERSION N/A 08/15/2015   Procedure: CARDIOVERSION;  Surgeon: Leonard Hector, MD;  Location: [facility] ENDOSCOPY;  Service: Cardiovascular;  Laterality: N/A;  . MASS EXCISION Right 10/22/2015   Procedure: EXCISION MASS RIGHT THUMB;  Surgeon: Leonard Brod, MD;  Location: [place];  Service: Orthopedics;  Laterality: Right;  . TONSILLECTOMY      There were no vitals filed for this visit.  Subjective Assessment - 11/23/17 0937    Subjective  Its a little better.  Went bowling last night and I could not get it loosened up!     Currently in Pain?  No/denies         [facility] Adult PT Treatment/Exercise - 11/23/17 0001      Shoulder Exercises: Sidelying   External Rotation  Strengthening;Right;10 reps    External Rotation Weight (lbs)  3    Flexion  Strengthening;Right;10 reps    Flexion Weight (lbs)  3    ABduction  Strengthening;Right;10 reps    ABduction Weight (lbs)   3      Shoulder Exercises: Standing   Row  Strengthening;Right;15 reps    Row Weight (lbs)  4    Other Standing Exercises  triceps 4 lbs x 10     Other Standing Exercises  standing cane x 10: flexion, abduction extension and Internal rotation       Shoulder Exercises: Pulleys   Flexion  3 minutes      Shoulder Exercises: ROM/Strengthening   UBE (Upper Arm Bike)  5 min 1 , each direction 2.5 min     Other ROM/Strengthening Exercises  UE Ranger flexion x 10, abduction, scaption x 10     Other ROM/Strengthening Exercises  cross body reaching x 10       Shoulder Exercises: Stretch   Corner Stretch  3 reps    Internal Rotation Stretch  10 seconds    Internal Rotation Stretch Limitations  x 10     Wall Stretch - Flexion  --    Wall Stretch - ABduction  --    Other Shoulder Stretches  sleeper stretch, 15 sec x 3       Cryotherapy   Cryotherapy Location  Shoulder    Type of Cryotherapy  Ice pack  Manual Therapy   Manual Therapy  Joint mobilization;Soft tissue mobilization;Myofascial release;Passive ROM    Joint Mobilization  Gr I-II , scapular mob     Passive ROM  All planes to tol Rt UE                   PT Long Term Goals - 11/23/17 1003      PT LONG TERM GOAL #1   Title  Pt will be I for bilateral UE AROM, strength and posture     Status  On-going      PT LONG TERM GOAL #2   Title  Pt will be able to report 25% less stiffness in Rt UE pain after waking in the AM (even before stretches)     Status  On-going      PT LONG TERM GOAL #3   Title  FOTO score will improve to <26% impaired to demo improvement in functional mobility.     Status  On-going      PT LONG TERM GOAL #4   Title  Pt will be able to return to PLOF for recreational bowling and yardwork without limitation of pain.     Baseline  back to some things,  bowling,  not good scoring yet    Status  On-going      PT LONG TERM GOAL #5   Title  Pt will be able to reach with Rt UE behind his back  without increased pain for hygiene, dressing     Status  On-going            Plan - 11/23/17 1004    Clinical Impression Statement  Patient improving with AROM, still feels very stiff in AM and can usually stretch it back to where he has no pain.  Weak in ER and tight in IR.      PT Treatment/Interventions  ADLs/Self Care Home Management;Electrical Stimulation;Functional mobility training;Neuromuscular re-education;Iontophoresis 4mg /ml Dexamethasone;Moist Heat;Cryotherapy;Ultrasound;Therapeutic exercise;Patient/family education;Dry needling;Manual techniques;Passive range of motion;Taping;Therapeutic activities    PT Next Visit Plan  continue self mobs, manual, check sleeper stretch  work toward goals.      PT Home Exercise Plan  wall stretches, ER red band unattached,  self mob with rolled towel,  sleeper stretch posterior capsule.    Consulted and Agree with Plan of Care  Patient       Patient will benefit from skilled therapeutic intervention in order to improve the following deficits and impairments:  Pain, Postural dysfunction, Impaired UE functional use, Increased fascial restricitons, Impaired flexibility, Decreased strength, Decreased range of motion, Hypomobility  Visit Diagnosis: Chronic right shoulder pain  Stiffness of right shoulder, not elsewhere classified  Muscle weakness (generalized)     Problem List Patient Active Problem List   Diagnosis Date Noted  . Acute pain of both shoulders 10/25/2017  . Bilateral acromioclavicular joint arthritis 10/25/2017  . Primary osteoarthritis of both hips 10/25/2017  . Proteinuria 10/25/2017  . History of atrial fibrillation 10/25/2017  . History of colon polyps 10/25/2017  . ANKLE PAIN, RIGHT 02/18/2010  . SINUS TARSI SYNDROME, RIGHT FOOT 02/18/2010  . Essential hypertension 05/09/2008  . PAROXYSMAL ATRIAL FIBRILLATION 05/09/2008  . ACHILLES BURSITIS OR TENDINITIS 12/02/2007  . HEEL PAIN, RIGHT 12/02/2007  . ACHILLES  TENDON TEAR 12/02/2007    Leonard Campbell 11/23/2017, 10:14 AM  [facility] [address] [place], [place], [address] Phone: [phone]   Fax:  [phone]  Name: Leonard Campbell MRN: [id number] Date of  Birth: [birth year]  Leonard Campbell, PT 11/23/17 10:14 AM Phone: [phone] Fax: [phone]

## 2017-11-26 ENCOUNTER — Ambulatory Visit: Payer: 59 | Admitting: Physical Therapy

## 2017-11-26 ENCOUNTER — Encounter: Payer: Self-pay | Admitting: Physical Therapy

## 2017-11-26 DIAGNOSIS — M6281 Muscle weakness (generalized): Secondary | ICD-10-CM

## 2017-11-26 DIAGNOSIS — M25611 Stiffness of right shoulder, not elsewhere classified: Secondary | ICD-10-CM | POA: Diagnosis not present

## 2017-11-26 DIAGNOSIS — M79661 Pain in right lower leg: Secondary | ICD-10-CM | POA: Diagnosis not present

## 2017-11-26 DIAGNOSIS — M25511 Pain in right shoulder: Secondary | ICD-10-CM | POA: Diagnosis not present

## 2017-11-26 DIAGNOSIS — M79671 Pain in right foot: Secondary | ICD-10-CM | POA: Diagnosis not present

## 2017-11-26 DIAGNOSIS — G8929 Other chronic pain: Secondary | ICD-10-CM | POA: Diagnosis not present

## 2017-11-26 NOTE — Therapy (Signed)
[place], [place], [address] Phone: [phone]   Fax:  [phone]  Physical Therapy Treatment  Patient Details  Name: Leonard Campbell MRN: [id number] Date of Birth: 01/15/1951 Referring Provider (PT): Dr Melrose Nakayama   Encounter Date: 11/26/2017  PT End of Session - 11/26/17 1111    Visit Number  4    Number of Visits  12    Date for PT Re-Evaluation  12/21/17    PT Start Time  1102    PT Stop Time  1152    PT Time Calculation (min)  50 min       Past Medical History:  Diagnosis Date  . Achilles bursitis or tendinitis   . ACHILLES TENDON TEAR   . ANKLE PAIN, RIGHT   . Complication of anesthesia    None per pt!  Marland Kitchen HEEL PAIN, RIGHT   . Hemorrhoids   . HYPERTENSION   . PAROXYSMAL ATRIAL FIBRILLATION   . SINUS TARSI SYNDROME, RIGHT FOOT     Past Surgical History:  Procedure Laterality Date  . CARDIOVERSION N/A 08/15/2015   Procedure: CARDIOVERSION;  Surgeon: Josue Hector, MD;  Location: [facility] ENDOSCOPY;  Service: Cardiovascular;  Laterality: N/A;  . MASS EXCISION Right 10/22/2015   Procedure: EXCISION MASS RIGHT THUMB;  Surgeon: Daryll Brod, MD;  Location: [place];  Service: Orthopedics;  Laterality: Right;  . TONSILLECTOMY      There were no vitals filed for this visit.  Subjective Assessment - 11/26/17 1104    Subjective  My shoulder feels the best it has in 6 months.     Currently in Pain?  No/denies                       [facility] Adult PT Treatment/Exercise - 11/26/17 0001      Exercises   Exercises  Shoulder      Shoulder Exercises: Supine   Horizontal ABduction  20 reps    Theraband Level (Shoulder Horizontal ABduction)  Level 2 (Red)    External Rotation  20 reps;Theraband    Theraband Level (Shoulder External Rotation)  Level 2 (Red)      Shoulder Exercises: Sidelying   External Rotation  Strengthening;Right;10 reps    External Rotation  Weight (lbs)  3    Flexion  Strengthening;Right;10 reps    Flexion Weight (lbs)  3    ABduction  --    ABduction Weight (lbs)  --      Shoulder Exercises: Standing   External Rotation  20 reps;Theraband;Right;Left    Theraband Level (Shoulder External Rotation)  Level 2 (Red)    Row  Both;20 reps;Theraband    Theraband Level (Shoulder Row)  Level 3 (Green)    Other Standing Exercises  standing cane extension, abduction extension and Internal rotation , bilateral wall slides       Shoulder Exercises: Pulleys   Flexion  --      Shoulder Exercises: ROM/Strengthening   UBE (Upper Arm Bike)  5 min L 2  , each direction 2.5 min       Shoulder Exercises: Stretch   Corner Stretch  3 reps    Internal Rotation Stretch  10 seconds    Internal Rotation Stretch Limitations  x 10     Wall Stretch - Flexion  3 reps;20 seconds   in doorway   Wall Stretch - ABduction  5 reps      Cryotherapy  Cryotherapy Location  Shoulder    Type of Cryotherapy  Ice pack      Manual Therapy   Joint Mobilization  Right A/P and inferior glides folowed by PROM to improved flexion, IR and ER                   PT Long Term Goals - 11/23/17 1003      PT LONG TERM GOAL #1   Title  Pt will be I for bilateral UE AROM, strength and posture     Status  On-going      PT LONG TERM GOAL #2   Title  Pt will be able to report 25% less stiffness in Rt UE pain after waking in the AM (even before stretches)     Status  On-going      PT LONG TERM GOAL #3   Title  FOTO score will improve to <26% impaired to demo improvement in functional mobility.     Status  On-going      PT LONG TERM GOAL #4   Title  Pt will be able to return to PLOF for recreational bowling and yardwork without limitation of pain.     Baseline  back to some things,  bowling,  not good scoring yet    Status  On-going      PT LONG TERM GOAL #5   Title  Pt will be able to reach with Rt UE behind his back without increased pain for  hygiene, dressing     Status  On-going            Plan - 11/26/17 1134    Clinical Impression Statement  Significant imrovement in shoulder pain per patient. Morning stifness improving per patient report. He does note some upper back pain in the mornings. Continued with ROM and performed scapular stabilization exercises.     PT Next Visit Plan  continue self mobs, manual, check sleeper stretch  work toward goals.      PT Home Exercise Plan  wall stretches, ER red band unattached,  self mob with rolled towel,  sleeper stretch posterior capsule.    Consulted and Agree with Plan of Care  Patient       Patient will benefit from skilled therapeutic intervention in order to improve the following deficits and impairments:  Pain, Postural dysfunction, Impaired UE functional use, Increased fascial restricitons, Impaired flexibility, Decreased strength, Decreased range of motion, Hypomobility  Visit Diagnosis: Chronic right shoulder pain  Stiffness of right shoulder, not elsewhere classified  Muscle weakness (generalized)     Problem List Patient Active Problem List   Diagnosis Date Noted  . Acute pain of both shoulders 10/25/2017  . Bilateral acromioclavicular joint arthritis 10/25/2017  . Primary osteoarthritis of both hips 10/25/2017  . Proteinuria 10/25/2017  . History of atrial fibrillation 10/25/2017  . History of colon polyps 10/25/2017  . ANKLE PAIN, RIGHT 02/18/2010  . SINUS TARSI SYNDROME, RIGHT FOOT 02/18/2010  . Essential hypertension 05/09/2008  . PAROXYSMAL ATRIAL FIBRILLATION 05/09/2008  . ACHILLES BURSITIS OR TENDINITIS 12/02/2007  . HEEL PAIN, RIGHT 12/02/2007  . ACHILLES TENDON TEAR 12/02/2007    Dorene Ar, PTA 11/26/2017, 11:43 AM  [facility] [address] [place], [place], [address] Phone: [phone]   Fax:  [phone]  Name: JAKIM DRAPEAU MRN: [id number] Date of Birth:  Feb 17, 1950

## 2017-11-30 ENCOUNTER — Ambulatory Visit: Payer: 59 | Admitting: Physical Therapy

## 2017-11-30 ENCOUNTER — Encounter: Payer: Self-pay | Admitting: Physical Therapy

## 2017-11-30 DIAGNOSIS — G8929 Other chronic pain: Secondary | ICD-10-CM

## 2017-11-30 DIAGNOSIS — Z23 Encounter for immunization: Secondary | ICD-10-CM | POA: Diagnosis not present

## 2017-11-30 DIAGNOSIS — M6281 Muscle weakness (generalized): Secondary | ICD-10-CM

## 2017-11-30 DIAGNOSIS — M25511 Pain in right shoulder: Principal | ICD-10-CM

## 2017-11-30 DIAGNOSIS — M25611 Stiffness of right shoulder, not elsewhere classified: Secondary | ICD-10-CM

## 2017-11-30 DIAGNOSIS — M79661 Pain in right lower leg: Secondary | ICD-10-CM | POA: Diagnosis not present

## 2017-11-30 DIAGNOSIS — M79671 Pain in right foot: Secondary | ICD-10-CM | POA: Diagnosis not present

## 2017-11-30 MED FILL — LISINOPRIL-HCTZ 20-25 MG TA: 20-25 | 90 days supply | Qty: 90 | Fill #2

## 2017-11-30 NOTE — Therapy (Signed)
[place], [place], [address] Phone: [phone]   Fax:  [phone]  Physical Therapy Treatment  Patient Details  Name: Leonard Campbell MRN: [id number] Date of Birth: [birth year] Referring Provider (PT): Dr Melrose Nakayama   Encounter Date: 11/30/2017  PT End of Session - 11/30/17 1501    Visit Number  5    Number of Visits  12    Date for PT Re-Evaluation  12/21/17    PT Start Time  4010    PT Stop Time  1511    PT Time Calculation (min)  56 min    Activity Tolerance  Patient tolerated treatment well    Behavior During Therapy  [facility] for tasks assessed/performed       Past Medical History:  Diagnosis Date  . Achilles bursitis or tendinitis   . ACHILLES TENDON TEAR   . ANKLE PAIN, RIGHT   . Complication of anesthesia    None per pt!  Marland Kitchen HEEL PAIN, RIGHT   . Hemorrhoids   . HYPERTENSION   . PAROXYSMAL ATRIAL FIBRILLATION   . SINUS TARSI SYNDROME, RIGHT FOOT     Past Surgical History:  Procedure Laterality Date  . CARDIOVERSION N/A 08/15/2015   Procedure: CARDIOVERSION;  Surgeon: Josue Hector, MD;  Location: [facility] ENDOSCOPY;  Service: Cardiovascular;  Laterality: N/A;  . MASS EXCISION Right 10/22/2015   Procedure: EXCISION MASS RIGHT THUMB;  Surgeon: Daryll Brod, MD;  Location: [place];  Service: Orthopedics;  Laterality: Right;  . TONSILLECTOMY      There were no vitals filed for this visit.  Subjective Assessment - 11/30/17 1421    Subjective  Shoulder wakes 5:30- 6:30 in the morningd 7-8/10/  Motion is better.  Able to bowl  2 leagues a week  without pain.  Pain across upper back came after my arms started moving better.    Pain Score  0-No pain   up to 7/10   Pain Location  Back    Pain Orientation  Posterior;Upper    Pain Descriptors / Indicators  Aching;Dull;Throbbing    Pain Radiating Towards  across upper back    Aggravating Factors   sleeping on shoulder    Pain  Relieving Factors  tylenol,  moving around in am    Effect of Pain on Daily Activities  limits sleeping         [facility] PT Assessment - 11/30/17 0001      AROM   Right Shoulder Flexion  160 Degrees    Right Shoulder ABduction  160 Degrees    Right Shoulder External Rotation  70 Degrees      PROM   Overall PROM Comments  PROM  WNL  IR/ER                   [facility] Adult PT Treatment/Exercise - 11/30/17 0001      Shoulder Exercises: ROM/Strengthening   UBE (Upper Arm Bike)  5 min L 2  , each direction 2.5 min       Shoulder Exercises: Stretch   Other Shoulder Stretches  ER  WNL 3 x 10 seconds  IR limited 3 X 10 seconds and 2 reps of 10 second 3 step contract relax  Sleeper stretches 4/10 pain post this    Other Shoulder Stretches  doorway stretch both 3 X 30 seconds       Cryotherapy   Cryotherapy Location  Shoulder    Type of Cryotherapy  --  cold pack  10 minutes     Manual Therapy   Manual Therapy  Joint mobilization;Passive ROM    Manual therapy comments  pain 4/10     Joint Mobilization  inferior/posterior and lateral shoulder glides grade II,III    Passive ROM  Shoulder all ranges             PT Education - 11/30/17 1500    Education Details  no          PT Long Term Goals - 11/30/17 1810      PT LONG TERM GOAL #1   Title  Pt will be I for bilateral UE AROM, strength and posture     Baseline  Independent with exercises so far    Time  6    Period  Weeks    Status  On-going      PT LONG TERM GOAL #2   Title  Pt will be able to report 25% less stiffness in Rt UE pain after waking in the AM (even before stretches)     Baseline  shoulder not stiff in am,  upper back stiff in am.    Time  6    Period  Weeks    Status  Partially Met      PT LONG TERM GOAL #3   Title  FOTO score will improve to <26% impaired to demo improvement in functional mobility.     Time  4    Period  Weeks    Status  Unable to assess      PT LONG TERM GOAL #4    Title  Pt will be able to return to PLOF for recreational bowling and yardwork without limitation of pain.     Baseline  Back to baseline per report    Time  6    Period  Weeks    Status  Achieved      PT LONG TERM GOAL #5   Title  Pt will be able to reach with Rt UE behind his back without increased pain for hygiene, dressing     Baseline  less painful can now don belt .  Pain number not discussed    Time  6    Period  Weeks    Status  Partially Met            Plan - 11/30/17 1807    Clinical Impression Statement  4/10 pain after manual  and stretching.  This is addressed with cold pack. ROM improving see flow sheet.  Patient has returned to all activities for his ADL .   LTG#4 met,  LTG'S  # 2, # 5 partially met.     PT Next Visit Plan  continue self mobs, manual, check sleeper stretch  work toward goals.      PT Home Exercise Plan  wall stretches, ER red band unattached,  self mob with rolled towel,  sleeper stretch posterior capsule.    Consulted and Agree with Plan of Care  Patient       Patient will benefit from skilled therapeutic intervention in order to improve the following deficits and impairments:     Visit Diagnosis: Chronic right shoulder pain  Stiffness of right shoulder, not elsewhere classified  Muscle weakness (generalized)     Problem List Patient Active Problem List   Diagnosis Date Noted  . Acute pain of both shoulders 10/25/2017  . Bilateral acromioclavicular joint arthritis 10/25/2017  . Primary osteoarthritis of both hips 10/25/2017  .  Proteinuria 10/25/2017  . History of atrial fibrillation 10/25/2017  . History of colon polyps 10/25/2017  . ANKLE PAIN, RIGHT 02/18/2010  . SINUS TARSI SYNDROME, RIGHT FOOT 02/18/2010  . Essential hypertension 05/09/2008  . PAROXYSMAL ATRIAL FIBRILLATION 05/09/2008  . ACHILLES BURSITIS OR TENDINITIS 12/02/2007  . HEEL PAIN, RIGHT 12/02/2007  . ACHILLES TENDON TEAR 12/02/2007    HARRIS,KAREN  PTA 11/30/2017, 6:16 PM  [facility] [address] [place], [place], [address] Phone: [phone]   Fax:  [phone]  Name: Leonard Campbell MRN: [id number] Date of Birth: Jan 17, 1951

## 2017-12-02 ENCOUNTER — Ambulatory Visit: Payer: 59 | Admitting: Physical Therapy

## 2017-12-07 ENCOUNTER — Ambulatory Visit: Payer: 59 | Admitting: Physical Therapy

## 2017-12-07 ENCOUNTER — Encounter: Payer: Self-pay | Admitting: Physical Therapy

## 2017-12-07 DIAGNOSIS — M6281 Muscle weakness (generalized): Secondary | ICD-10-CM

## 2017-12-07 DIAGNOSIS — M79661 Pain in right lower leg: Secondary | ICD-10-CM | POA: Diagnosis not present

## 2017-12-07 DIAGNOSIS — M25511 Pain in right shoulder: Principal | ICD-10-CM

## 2017-12-07 DIAGNOSIS — G8929 Other chronic pain: Secondary | ICD-10-CM | POA: Diagnosis not present

## 2017-12-07 DIAGNOSIS — M79671 Pain in right foot: Secondary | ICD-10-CM | POA: Diagnosis not present

## 2017-12-07 DIAGNOSIS — M25611 Stiffness of right shoulder, not elsewhere classified: Secondary | ICD-10-CM | POA: Diagnosis not present

## 2017-12-07 NOTE — Patient Instructions (Signed)
Issued from exercise drawer:  Scapular stabilization standing Red band 5-10 x each\1 second hold 3-4 x a week All issued

## 2017-12-07 NOTE — Therapy (Signed)
[place], [place], [address] Phone: [phone]   Fax:  [phone]  Physical Therapy Treatment  Patient Details  Name: Leonard Campbell MRN: [id number] Date of Birth: May 26, 1950 Referring Provider (PT): Dr Melrose Nakayama   Encounter Date: 12/07/2017  PT End of Session - 12/07/17 1254    Number of Visits  12    Date for PT Re-Evaluation  12/21/17    PT Start Time  1148    PT Stop Time  1242    PT Time Calculation (min)  54 min    Activity Tolerance  Patient tolerated treatment well    Behavior During Therapy  [facility] for tasks assessed/performed       Past Medical History:  Diagnosis Date  . Achilles bursitis or tendinitis   . ACHILLES TENDON TEAR   . ANKLE PAIN, RIGHT   . Complication of anesthesia    None per pt!  Marland Kitchen HEEL PAIN, RIGHT   . Hemorrhoids   . HYPERTENSION   . PAROXYSMAL ATRIAL FIBRILLATION   . SINUS TARSI SYNDROME, RIGHT FOOT     Past Surgical History:  Procedure Laterality Date  . CARDIOVERSION N/A 08/15/2015   Procedure: CARDIOVERSION;  Surgeon: Josue Hector, MD;  Location: [facility] ENDOSCOPY;  Service: Cardiovascular;  Laterality: N/A;  . MASS EXCISION Right 10/22/2015   Procedure: EXCISION MASS RIGHT THUMB;  Surgeon: Daryll Brod, MD;  Location: [place];  Service: Orthopedics;  Laterality: Right;  . TONSILLECTOMY      There were no vitals filed for this visit.  Subjective Assessment - 12/07/17 1151    Subjective  Less stiffness in the morning.  My shoulder has benn feeling better than it has in awhile.  I do the exercises the best that aI can.  I can only come 1 X this week and I thing next week will be my last.   has not needed ice or heat LATELY    Currently in Pain?  No/denies    Pain Score  0-No pain   pain up to 4/10   Pain Location  Back    Pain Orientation  Posterior;Upper    Pain Descriptors / Indicators  Sore    Pain Type  Chronic pain    Pain  Radiating Towards  across upper back , wakes in the morning    Pain Onset  More than a month ago    Aggravating Factors   reaching behind,  sleeping on shoulder    Pain Relieving Factors  tylenol,  moving arm around                       [facility] Adult PT Treatment/Exercise - 12/07/17 0001      Shoulder Exercises: Sidelying   Other Sidelying Exercises  bOOK OPENER 5 X each side to adddress am pain across shoulders      Shoulder Exercises: Standing   Horizontal ABduction  10 reps    Theraband Level (Shoulder Horizontal ABduction)  Level 2 (Red)   challanging   External Rotation  10 reps    Theraband Level (Shoulder External Rotation)  Level 2 (Red)    Flexion  20 reps   wall slides   Extension  5 reps;Both    Theraband Level (Shoulder Extension)  Level 2 (Red)    Extension Limitations  challanging,  HEP,  cues needed with all band exercises initially    Diagonals  5 reps;Both  Theraband Level (Shoulder Diagonals)  Level 2 (Red)    Diagonals Limitations  cued,  hep      Shoulder Exercises: ROM/Strengthening   UBE (Upper Arm Bike)  5 min L 2  , each direction 2.5 min       Cryotherapy   Cryotherapy Location  Shoulder    Type of Cryotherapy  --   COLD PACK     Manual Therapy   Manual Therapy  Joint mobilization;Passive ROM    Joint Mobilization  inferior/posterior and lateral shoulder glides grade II,III    Passive ROM  Shoulder all ranges             PT Education - 12/07/17 1254    Education Details  HEP    Person(s) Educated  Patient    Methods  Explanation;Demonstration;Tactile cues;Verbal cues;Handout    Comprehension  Verbalized understanding;Returned demonstration          PT Long Term Goals - 12/07/17 1152      PT LONG TERM GOAL #1   Title  Pt will be I for bilateral UE AROM, strength and posture     Baseline  Independent with exercises so far    Time  6    Period  Weeks    Status  On-going      PT LONG TERM GOAL #2   Title  Pt  will be able to report 25% less stiffness in Rt UE pain after waking in the AM (even before stretches)     Baseline  70% improvement    Time  6    Period  Weeks    Status  Achieved      PT LONG TERM GOAL #3   Title  FOTO score will improve to <26% impaired to demo improvement in functional mobility.     Time  4    Period  Weeks    Status  Unable to assess      PT LONG TERM GOAL #4   Title  Pt will be able to return to PLOF for recreational bowling and yardwork without limitation of pain.     Baseline  Back to baseline per report    Time  6    Period  Weeks    Status  Achieved      PT LONG TERM GOAL #5   Title  Pt will be able to reach with Rt UE behind his back without increased pain for hygiene, dressing     Baseline  pain 4/10    Time  6    Period  Weeks    Status  Partially Met            Plan - 12/07/17 1254    Clinical Impression Statement  3-2/10  exercise pain at end of session.  LTG#2 met.  LTG#5 partly met. Patient was able to progress to standing exercises with band for scapular stabilixation.  These exercises were challanging.    PT Next Visit Plan  review standing band exercises  2 more visits.  progress strength and endurance,  consider ball on the wall etc.     PT Home Exercise Plan  wall stretches, ER red band unattached,  self mob with rolled towel,  sleeper stretch posterior capsule.  standing horizontal abduction, extension, diagonals and ER red band    Consulted and Agree with Plan of Care  Patient       Patient will benefit from skilled therapeutic intervention in order to improve the following deficits  and impairments:     Visit Diagnosis: Chronic right shoulder pain  Stiffness of right shoulder, not elsewhere classified  Muscle weakness (generalized)     Problem List Patient Active Problem List   Diagnosis Date Noted  . Acute pain of both shoulders 10/25/2017  . Bilateral acromioclavicular joint arthritis 10/25/2017  . Primary  osteoarthritis of both hips 10/25/2017  . Proteinuria 10/25/2017  . History of atrial fibrillation 10/25/2017  . History of colon polyps 10/25/2017  . ANKLE PAIN, RIGHT 02/18/2010  . SINUS TARSI SYNDROME, RIGHT FOOT 02/18/2010  . Essential hypertension 05/09/2008  . PAROXYSMAL ATRIAL FIBRILLATION 05/09/2008  . ACHILLES BURSITIS OR TENDINITIS 12/02/2007  . HEEL PAIN, RIGHT 12/02/2007  . ACHILLES TENDON TEAR 12/02/2007    HARRIS,KAREN PTA 12/07/2017, 1:06 PM  [facility] [address] [place], [place], [address] Phone: [phone]   Fax:  [phone]  Name: Leonard Campbell MRN: [id number] Date of Birth: 1950/08/19

## 2017-12-09 ENCOUNTER — Ambulatory Visit: Payer: 59 | Admitting: Physical Therapy

## 2017-12-14 ENCOUNTER — Encounter: Payer: Self-pay | Admitting: Physical Therapy

## 2017-12-14 ENCOUNTER — Ambulatory Visit: Payer: 59 | Admitting: Physical Therapy

## 2017-12-14 DIAGNOSIS — G8929 Other chronic pain: Secondary | ICD-10-CM

## 2017-12-14 DIAGNOSIS — M25611 Stiffness of right shoulder, not elsewhere classified: Secondary | ICD-10-CM

## 2017-12-14 DIAGNOSIS — M79661 Pain in right lower leg: Secondary | ICD-10-CM | POA: Diagnosis not present

## 2017-12-14 DIAGNOSIS — M25511 Pain in right shoulder: Principal | ICD-10-CM

## 2017-12-14 DIAGNOSIS — M6281 Muscle weakness (generalized): Secondary | ICD-10-CM

## 2017-12-14 DIAGNOSIS — M79671 Pain in right foot: Secondary | ICD-10-CM | POA: Diagnosis not present

## 2017-12-14 NOTE — Therapy (Signed)
[place], [place], [address] Phone: [phone]   Fax:  [phone]  Physical Therapy Treatment  Patient Details  Name: Leonard Campbell MRN: [id number] Date of Birth: [birth year] Referring Provider (PT): Dr Melrose Nakayama   Encounter Date: 12/14/2017  PT End of Session - 12/14/17 1308    Visit Number  6    Number of Visits  12    Date for PT Re-Evaluation  12/21/17    PT Start Time  1140    PT Stop Time  1230    PT Time Calculation (min)  50 min    Activity Tolerance  Patient tolerated treatment well    Behavior During Therapy  [facility] for tasks assessed/performed       Past Medical History:  Diagnosis Date  . Achilles bursitis or tendinitis   . ACHILLES TENDON TEAR   . ANKLE PAIN, RIGHT   . Complication of anesthesia    None per pt!  Marland Kitchen HEEL PAIN, RIGHT   . Hemorrhoids   . HYPERTENSION   . PAROXYSMAL ATRIAL FIBRILLATION   . SINUS TARSI SYNDROME, RIGHT FOOT     Past Surgical History:  Procedure Laterality Date  . CARDIOVERSION N/A 08/15/2015   Procedure: CARDIOVERSION;  Surgeon: Josue Hector, MD;  Location: [facility] ENDOSCOPY;  Service: Cardiovascular;  Laterality: N/A;  . MASS EXCISION Right 10/22/2015   Procedure: EXCISION MASS RIGHT THUMB;  Surgeon: Daryll Brod, MD;  Location: [place];  Service: Orthopedics;  Laterality: Right;  . TONSILLECTOMY      There were no vitals filed for this visit.  Subjective Assessment - 12/14/17 1140    Subjective  Stiff most of the day yesterday.  Did carry luggage  since last visit. i did not get much exercise done so i am stiff. upper back mostly.     Currently in Pain?  No/denies    Pain Score  --   in am throbbing 7/10  sometimes 5/10   Pain Location  Back    Pain Orientation  Upper;Posterior    Pain Descriptors / Indicators  --   stiff,     Pain Radiating Towards  across upper back  throbs after laying down about an hour.     Aggravating Factors   sleeping on shoulder,  reaching behind    Pain Relieving Factors  rolling shoulders.  Marland Kitchen tylenol    Effect of Pain on Daily Activities  woke last night                       [facility] Adult PT Treatment/Exercise - 12/14/17 0001      Self-Care   Posture  sitting  posture.  he has a tablet in his lap and reads with head down and forward trunk.  poor posture   cues and suggestions for handless stands to wedges to prop t     Shoulder Exercises: Supine   Horizontal ABduction  20 reps    Horizontal ABduction Limitations  using band over rolled towel between shoulders.    Other Supine Exercises  Decompression,  shoulder press,  head press, leg lengthener,  leg press  HEP for posture correction.        Shoulder Exercises: Standing   Horizontal ABduction  10 reps    Theraband Level (Shoulder Horizontal ABduction)  Level 1 (Yellow)    External Rotation  10 reps    Theraband Level (Shoulder External Rotation)  Level 1 (  Yellow)    Internal Rotation  15 reps    Internal Rotation Weight (lbs)  3    Internal Rotation Limitations  cane    Extension  10 reps    Theraband Level (Shoulder Extension)  Level 1 (Yellow)   single arm band braced at hip,  cues   Extension Limitations  also cane with 3 LBs 20 X    Other Standing Exercises  30 seconds each with green ball on wall circles each way,  horizontal add/ abduction, presses,  flexion extension,  fatigued.       Shoulder Exercises: ROM/Strengthening   UBE (Upper Arm Bike)  6 minutes a mix of forward/ reverse,  level one.      Shoulder Exercises: Stretch   Other Shoulder Stretches  thoracic extension stretch with rolled towel between shoulders i hooklying,  HEP for posture correction.    Other Shoulder Stretches  book opener 10 X each right side down to decrease upper back pain.   also 5 x each seated trunk rotation for posture correction.      Manual Therapy   Manual therapy comments  thera cane uses by patient  for trigger point release to levater and upper trap.  tightness reduced.        Neck Exercises: Stretches   Upper Trapezius Stretch  1 rep;20 seconds    Levator Stretch  2 reps;20 seconds    Levator Stretch Limitations  to target tightness upper back             PT Education - 12/14/17 1307    Education Details  HEP,  posture ed/self care,  theracane use    Person(s) Educated  Patient    Methods  Explanation;Demonstration;Tactile cues;Verbal cues;Handout    Comprehension  Returned demonstration;Verbalized understanding          PT Long Term Goals - 12/07/17 1152      PT LONG TERM GOAL #1   Title  Pt will be I for bilateral UE AROM, strength and posture     Baseline  Independent with exercises so far    Time  6    Period  Weeks    Status  On-going      PT LONG TERM GOAL #2   Title  Pt will be able to report 25% less stiffness in Rt UE pain after waking in the AM (even before stretches)     Baseline  70% improvement    Time  6    Period  Weeks    Status  Achieved      PT LONG TERM GOAL #3   Title  FOTO score will improve to <26% impaired to demo improvement in functional mobility.     Time  4    Period  Weeks    Status  Unable to assess      PT LONG TERM GOAL #4   Title  Pt will be able to return to PLOF for recreational bowling and yardwork without limitation of pain.     Baseline  Back to baseline per report    Time  6    Period  Weeks    Status  Achieved      PT LONG TERM GOAL #5   Title  Pt will be able to reach with Rt UE behind his back without increased pain for hygiene, dressing     Baseline  pain 4/10    Time  6    Period  Weeks  Status  Partially Met            Plan - 12/14/17 1309    Clinical Impression Statement  Progressed his HEP to include posture correction due to pain flare since last visit.  He has not been doing HEP recently due to death in family and travel to funeral in [place].  He has not lost range.  One more session scheduled.  Patient feels he will be ready for D/C and continue on his own.     PT Next Visit Plan  last visit.  FOTO, measure,  check goals.    PT Home Exercise Plan  wall stretches, ER red band unattached,  self mob with rolled towel,  sleeper stretch posterior capsule.  standing horizontal abduction, extension, diagonals and ER red band,   Thoracic extension stretch, decompression series.     Consulted and Agree with Plan of Care  Patient       Patient will benefit from skilled therapeutic intervention in order to improve the following deficits and impairments:     Visit Diagnosis: Chronic right shoulder pain  Stiffness of right shoulder, not elsewhere classified  Muscle weakness (generalized)     Problem List Patient Active Problem List   Diagnosis Date Noted  . Acute pain of both shoulders 10/25/2017  . Bilateral acromioclavicular joint arthritis 10/25/2017  . Primary osteoarthritis of both hips 10/25/2017  . Proteinuria 10/25/2017  . History of atrial fibrillation 10/25/2017  . History of colon polyps 10/25/2017  . ANKLE PAIN, RIGHT 02/18/2010  . SINUS TARSI SYNDROME, RIGHT FOOT 02/18/2010  . Essential hypertension 05/09/2008  . PAROXYSMAL ATRIAL FIBRILLATION 05/09/2008  . ACHILLES BURSITIS OR TENDINITIS 12/02/2007  . HEEL PAIN, RIGHT 12/02/2007  . ACHILLES TENDON TEAR 12/02/2007    Brentley Landfair  PTA 12/14/2017, 1:13 PM  [facility] [address] [place], [place], [address] Phone: [phone]   Fax:  [phone]  Name: Leonard Campbell MRN: [id number] Date of Birth: 11/20/1950

## 2017-12-14 NOTE — Patient Instructions (Signed)
Issued decompression series from exercise drawer PRN Decompression exercise 1 x a day 5 to 15 minutes All other exercises issued on page  5 x each ,  5 second hold each   Thoracic extension stretch with rolled towel.(  From exercise drawer) PRN 1 X 30 seconds to 30 minutes.

## 2017-12-16 ENCOUNTER — Ambulatory Visit: Payer: 59 | Admitting: Physical Therapy

## 2017-12-16 DIAGNOSIS — M25611 Stiffness of right shoulder, not elsewhere classified: Secondary | ICD-10-CM

## 2017-12-16 DIAGNOSIS — M6281 Muscle weakness (generalized): Secondary | ICD-10-CM | POA: Diagnosis not present

## 2017-12-16 DIAGNOSIS — M25511 Pain in right shoulder: Secondary | ICD-10-CM | POA: Diagnosis not present

## 2017-12-16 DIAGNOSIS — G8929 Other chronic pain: Secondary | ICD-10-CM

## 2017-12-16 DIAGNOSIS — M79661 Pain in right lower leg: Secondary | ICD-10-CM | POA: Diagnosis not present

## 2017-12-16 DIAGNOSIS — M79671 Pain in right foot: Secondary | ICD-10-CM | POA: Diagnosis not present

## 2017-12-16 NOTE — Therapy (Signed)
[place], [place], [address] Phone: [phone]   Fax:  [phone]  Physical Therapy Treatment/Discharge   Patient Details  Name: Leonard Campbell MRN: [id number] Date of Birth: May 13, 1950 Referring Provider (PT): Dr Melrose Nakayama   Encounter Date: 12/16/2017  PT End of Session - 12/16/17 1231    Visit Number  8    Number of Visits  12    Date for PT Re-Evaluation  12/21/17    PT Start Time  1145    PT Stop Time  1221    PT Time Calculation (min)  36 min    Activity Tolerance  Patient tolerated treatment well    Behavior During Therapy  [facility] for tasks assessed/performed       Past Medical History:  Diagnosis Date  . Achilles bursitis or tendinitis   . ACHILLES TENDON TEAR   . ANKLE PAIN, RIGHT   . Complication of anesthesia    None per pt!  Marland Kitchen HEEL PAIN, RIGHT   . Hemorrhoids   . HYPERTENSION   . PAROXYSMAL ATRIAL FIBRILLATION   . SINUS TARSI SYNDROME, RIGHT FOOT     Past Surgical History:  Procedure Laterality Date  . CARDIOVERSION N/A 08/15/2015   Procedure: CARDIOVERSION;  Surgeon: Josue Hector, MD;  Location: [facility] ENDOSCOPY;  Service: Cardiovascular;  Laterality: N/A;  . MASS EXCISION Right 10/22/2015   Procedure: EXCISION MASS RIGHT THUMB;  Surgeon: Daryll Brod, MD;  Location: [facility];  Service: Orthopedics;  Laterality: Right;  . TONSILLECTOMY      There were no vitals filed for this visit.  Subjective Assessment - 12/16/17 1149    Subjective  I feel better today than I have in a long time.     Currently in Pain?  No/denies         [facility] PT Assessment - 12/16/17 0001      AROM   Right Shoulder Flexion  160 Degrees    Right Shoulder Internal Rotation  --   FR to lumbar    Right Shoulder External Rotation  --   FR to upper T   Left Shoulder Flexion  145 Degrees    Left Shoulder Internal Rotation  --   Fr to mid R thoracic    Left Shoulder External  Rotation  65 Degrees   Fr to upper T      Strength   Right Shoulder Flexion  4+/5    Right Shoulder ABduction  4/5    Right Shoulder Internal Rotation  5/5    Right Shoulder External Rotation  4+/5    Left Shoulder Flexion  4+/5    Left Shoulder ABduction  4/5    Left Shoulder Internal Rotation  5/5    Left Shoulder External Rotation  4+/5    Right Hand Grip (lbs)  95 lbs     Left Hand Grip (lbs)  42 lbs                    [facility] Adult PT Treatment/Exercise - 12/16/17 0001      Shoulder Exercises: Supine   Horizontal ABduction  Strengthening;Both;10 reps    Theraband Level (Shoulder Horizontal ABduction)  Level 3 (Green)    External Rotation  Strengthening;Both;20 reps;Theraband    Theraband Level (Shoulder External Rotation)  Level 3 (Green)      Shoulder Exercises: Standing   Other Standing Exercises  ER stretching 3 ways: seated x 5 , standing x  5 in doorway 2 ways       Shoulder Exercises: ROM/Strengthening   UBE (Upper Arm Bike)  6 minutes a mix of forward/ reverse,  level one.      Shoulder Exercises: Stretch   Corner Stretch  3 reps             PT Education - 12/16/17 1230    Education Details  DC, final HEP     Person(s) Educated  Patient    Methods  Explanation;Handout    Comprehension  Verbalized understanding;Returned demonstration;Verbal cues required          PT Long Term Goals - 12/16/17 1159      PT LONG TERM GOAL #1   Title  Pt will be I for bilateral UE AROM, strength and posture     Status  Achieved      PT LONG TERM GOAL #2   Title  Pt will be able to report 25% less stiffness in Rt UE pain after waking in the AM (even before stretches)     Baseline  70% improvement    Status  Achieved      PT LONG TERM GOAL #3   Title  FOTO score will improve to <26% impaired to demo improvement in functional mobility.     Baseline  28%    Status  Partially Met      PT LONG TERM GOAL #4   Title  Pt will be able to return to PLOF for  recreational bowling and yardwork without limitation of pain.     Status  Achieved      PT LONG TERM GOAL #5   Title  Pt will be able to reach with Rt UE behind his back without increased pain for hygiene, dressing     Baseline  stiffness with IR     Status  Partially Met            Plan - 12/16/17 1201    Clinical Impression Statement  Patient has met all goals except for FOTO score, lacking 2 % of predicted. He did have min pain end range combined abd and ER.  He is pleased with his current level of function and knows his HEP.     PT Treatment/Interventions  ADLs/Self Care Home Management;Electrical Stimulation;Functional mobility training;Neuromuscular re-education;Iontophoresis 72m/ml Dexamethasone;Moist Heat;Cryotherapy;Ultrasound;Therapeutic exercise;Patient/family education;Dry needling;Manual techniques;Passive range of motion;Taping;Therapeutic activities    PT Next Visit Plan  NA    PT Home Exercise Plan  wall stretches, ER red band unattached,  self mob with rolled towel,  sleeper stretch posterior capsule.  standing horizontal abduction, extension, diagonals and ER red band,   Thoracic extension stretch, decompression series.     Consulted and Agree with Plan of Care  Patient       Patient will benefit from skilled therapeutic intervention in order to improve the following deficits and impairments:  Pain, Postural dysfunction, Impaired UE functional use, Increased fascial restricitons, Impaired flexibility, Decreased strength, Decreased range of motion, Hypomobility  Visit Diagnosis: Chronic right shoulder pain  Stiffness of right shoulder, not elsewhere classified  Muscle weakness (generalized)     Problem List Patient Active Problem List   Diagnosis Date Noted  . Acute pain of both shoulders 10/25/2017  . Bilateral acromioclavicular joint arthritis 10/25/2017  . Primary osteoarthritis of both hips 10/25/2017  . Proteinuria 10/25/2017  . History of atrial  fibrillation 10/25/2017  . History of colon polyps 10/25/2017  . ANKLE PAIN, RIGHT 02/18/2010  . SINUS  TARSI SYNDROME, RIGHT FOOT 02/18/2010  . Essential hypertension 05/09/2008  . PAROXYSMAL ATRIAL FIBRILLATION 05/09/2008  . ACHILLES BURSITIS OR TENDINITIS 12/02/2007  . HEEL PAIN, RIGHT 12/02/2007  . ACHILLES TENDON TEAR 12/02/2007    Norfleet Capers 12/16/2017, 12:54 PM  [facility] [address] [place], [place], [address] Phone: [phone]   Fax:  [phone]  Name: TORRIE LAFAVOR MRN: [id number] Date of Birth: Sep 18, 1950  PHYSICAL THERAPY DISCHARGE SUMMARY  Visits from Start of Care: 8  Current functional level related to goals / functional outcomes: See above    Remaining deficits: End range combined IR, extension.  Combined ER/abd.  AROM WFL otherwise   Education / Equipment: HEP, posture, anatomy, frozen shoulder  Plan: Patient agrees to discharge.  Patient goals were met. Patient is being discharged due to meeting the stated rehab goals.  ?????     Raeford Razor, PT 12/16/17 1:00 PM Phone: [phone] Fax: [phone]

## 2017-12-20 DIAGNOSIS — M25511 Pain in right shoulder: Secondary | ICD-10-CM | POA: Diagnosis not present

## 2017-12-20 MED FILL — MELOXICAM 15 MG TABLET: 15 | 30 days supply | Qty: 30 | Fill #0

## 2017-12-21 ENCOUNTER — Encounter: Payer: 59 | Admitting: Physical Therapy

## 2017-12-23 ENCOUNTER — Encounter: Payer: 59 | Admitting: Physical Therapy

## 2018-01-03 MED FILL — XARELTO 20 MG TABLET: 20 | 90 days supply | Qty: 90 | Fill #1

## 2018-02-03 MED FILL — AMLODIPINE BESYLATE 10 MG T: 10 | 90 days supply | Qty: 90 | Fill #3

## 2018-02-28 MED FILL — LISINOPRIL-HCTZ 20-25 MG TA: 20-25 | 90 days supply | Qty: 90 | Fill #3

## 2018-03-24 DIAGNOSIS — R972 Elevated prostate specific antigen [PSA]: Secondary | ICD-10-CM | POA: Diagnosis not present

## 2018-03-29 MED FILL — XARELTO 20 MG TABLET: 20 | 90 days supply | Qty: 90 | Fill #2

## 2018-03-31 DIAGNOSIS — R972 Elevated prostate specific antigen [PSA]: Secondary | ICD-10-CM | POA: Diagnosis not present

## 2018-03-31 DIAGNOSIS — N5201 Erectile dysfunction due to arterial insufficiency: Secondary | ICD-10-CM | POA: Diagnosis not present

## 2018-05-03 MED FILL — AMLODIPINE BESYLATE 10 MG T: 10 | 90 days supply | Qty: 90 | Fill #0

## 2018-05-23 MED FILL — LISINOPRIL-HCTZ 20-25 MG TA: 20-25 | 90 days supply | Qty: 90 | Fill #0

## 2018-07-02 MED FILL — XARELTO 20 MG TABLET: 20 | 90 days supply | Qty: 90 | Fill #3

## 2018-08-01 MED FILL — AMLODIPINE BESYLATE 10 MG T: 10 | 90 days supply | Qty: 90 | Fill #1

## 2018-08-23 MED FILL — LISINOPRIL-HCTZ 20-25 MG TA: 20-25 | 90 days supply | Qty: 90 | Fill #1

## 2018-09-13 DIAGNOSIS — Z0000 Encounter for general adult medical examination without abnormal findings: Secondary | ICD-10-CM | POA: Diagnosis not present

## 2018-09-13 DIAGNOSIS — I4891 Unspecified atrial fibrillation: Secondary | ICD-10-CM | POA: Diagnosis not present

## 2018-09-13 DIAGNOSIS — I10 Essential (primary) hypertension: Secondary | ICD-10-CM | POA: Diagnosis not present

## 2018-09-13 DIAGNOSIS — R809 Proteinuria, unspecified: Secondary | ICD-10-CM | POA: Diagnosis not present

## 2018-09-14 DIAGNOSIS — Z0000 Encounter for general adult medical examination without abnormal findings: Secondary | ICD-10-CM | POA: Diagnosis not present

## 2018-09-14 DIAGNOSIS — R809 Proteinuria, unspecified: Secondary | ICD-10-CM | POA: Diagnosis not present

## 2018-09-14 DIAGNOSIS — I10 Essential (primary) hypertension: Secondary | ICD-10-CM | POA: Diagnosis not present

## 2018-09-27 ENCOUNTER — Other Ambulatory Visit: Payer: Self-pay | Admitting: Cardiovascular Disease

## 2018-09-27 DIAGNOSIS — I4891 Unspecified atrial fibrillation: Secondary | ICD-10-CM

## 2018-09-27 DIAGNOSIS — Z8679 Personal history of other diseases of the circulatory system: Secondary | ICD-10-CM

## 2018-09-27 DIAGNOSIS — R972 Elevated prostate specific antigen [PSA]: Secondary | ICD-10-CM | POA: Diagnosis not present

## 2018-09-27 MED FILL — XARELTO 20 MG TABLET: 20 | 90 days supply | Qty: 90 | Fill #0

## 2018-09-27 NOTE — Telephone Encounter (Signed)
28m 96.6kg Scr 1.01 10/06/17 ccr 102mlmin Lovw/nishan 10/04/17 but pt has an appt scheduled

## 2018-10-02 NOTE — Progress Notes (Signed)
CARDIOLOGY OFFICE NOTE  Date:  10/05/2018    Leonard Campbell Date of Birth: Jun 04, 1950 Medical Record #[id number]  PCP:  Gaynelle Arabian, MD  Cardiologist:  Johnsie Cancel    Chief Complaint  Patient presents with  . Follow-up    History of Present Illness: Leonard Campbell is a 68 y.o. male who presents today for a one year check. Seen for Dr. Johnsie Cancel.   He has a history of PAF, CHADSVASC of 2, HTN and asymptomatic PVCs. He had a failed cardioversion back in 2017.   Last seen a year ago - felt to be doing ok. EKG with AF noted.   The patient does not have symptoms concerning for COVID-19 infection (fever, chills, cough, or new shortness of breath).   Comes in today. Here alone. He feels good. Has seen his PCP recently. Labs from last month noted. No chest pain. Not short of breath. He has no sensation of his AF. He is tolerating his Xarelto. No bleeding/bruising. Labs are stable. BP is good - he just sent in a diary to his PCP - it was all good. 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   Past Medical History:  Diagnosis Date  . Achilles bursitis or tendinitis   . ACHILLES TENDON TEAR   . ANKLE PAIN, RIGHT   . Complication of anesthesia    None per pt!  Marland Kitchen HEEL PAIN, RIGHT   . Hemorrhoids   . HYPERTENSION   . PAROXYSMAL ATRIAL FIBRILLATION   . SINUS TARSI SYNDROME, RIGHT FOOT     Past Surgical History:  Procedure Laterality Date  . CARDIOVERSION N/A 08/15/2015   Procedure: CARDIOVERSION;  Surgeon: Josue Hector, MD;  Location: [facility] ENDOSCOPY;  Service: Cardiovascular;  Laterality: N/A;  . MASS EXCISION Right 10/22/2015   Procedure: EXCISION MASS RIGHT THUMB;  Surgeon: Daryll Brod, MD;  Location: [place];  Service: Orthopedics;  Laterality: Right;  . TONSILLECTOMY       Medications: Current Meds  Medication Sig  . amLODipine (NORVASC) 10 MG tablet Take 1 tablet (10 mg total) by mouth daily.   Marland Kitchen lisinopril-hydrochlorothiazide (ZESTORETIC) 20-25 MG tablet Take 1 tablet by mouth daily.  . rivaroxaban (XARELTO) 20 MG TABS tablet Take 1 tablet (20 mg total) by mouth daily.  . [DISCONTINUED] amLODipine (NORVASC) 10 MG tablet Take 1 tablet (10 mg total) by mouth daily.  . [DISCONTINUED] lisinopril-hydrochlorothiazide (PRINZIDE,ZESTORETIC) 20-25 MG tablet Take 1 tablet by mouth daily.  . [DISCONTINUED] XARELTO 20 MG TABS tablet TAKE 1 TABLET BY MOUTH DAILY.   Current Facility-Administered Medications for the 10/05/18 encounter (Office Visit) with Burtis Junes, NP  Medication  . 0.9 %  sodium chloride infusion     Allergies: No Known Allergies  Social History: The patient  reports that he has never smoked. He has never used smokeless tobacco. He reports current alcohol use. He reports that he does not use drugs.   Family History: The patient's family history includes Breast cancer in his maternal aunt; Congestive Heart Failure in his brother; Healthy in his daughter and daughter; Heart failure in his mother; Hypertension in his brother and mother; Kidney disease in his mother; Lung cancer in his maternal aunt.   Review of Systems: Please see the history of present illness.   All other systems are reviewed and negative.   Physical Exam: VS:  BP 140/80 (BP Location: Left Arm, Patient  Position: Sitting, Cuff Size: Normal)   Pulse (!) 59   Ht 6' (1.829 m)   Wt 217 lb 6.4 oz (98.6 kg)   BMI 29.48 kg/m  .  BMI Body mass index is 29.48 kg/m.  Wt Readings from Last 3 Encounters:  10/05/18 217 lb 6.4 oz (98.6 kg)  11/08/17 213 lb (96.6 kg)  10/06/17 213 lb 6.4 oz (96.8 kg)    General: Pleasant. Well developed, well nourished and in no acute distress.   HEENT: Normal.  Neck: Supple, no JVD, carotid bruits, or masses noted.  Cardiac: Irregular irregular rhythm. His rate is ok. No murmurs, rubs, or gallops. No edema.  Respiratory:  Lungs are clear to auscultation bilaterally  with normal work of breathing.  GI: Soft and nontender.  MS: No deformity or atrophy. Gait and ROM intact.  Skin: Warm and dry. Color is normal.  Neuro:  Strength and sensation are intact and no gross focal deficits noted.  Psych: Alert, appropriate and with normal affect.   LABORATORY DATA:  EKG:  EKG is ordered today. This demonstrates AF with VR of 59.   Lab Results  Component Value Date   WBC 7.0 10/06/2017   HGB 14.4 10/06/2017   HCT 41.8 10/06/2017   PLT 298 10/06/2017   GLUCOSE 86 10/06/2017   ALT 15 10/06/2017   AST 13 10/06/2017   NA 142 10/06/2017   K 4.3 10/06/2017   CL 103 10/06/2017   CREATININE 1.01 10/06/2017   BUN 16 10/06/2017   CO2 28 10/06/2017   TSH 0.63 07/17/2015   INR 1.2 (H) 08/09/2015       BNP (last 3 results) No results for input(s): BNP in the last 8760 hours.  ProBNP (last 3 results) No results for input(s): PROBNP in the last 8760 hours.   Other Studies Reviewed Today:  Stress Echo Study Conclusions 2016  - Stress ECG conclusions: There were no stress arrhythmias or   conduction abnormalities. The stress ECG was negative for   ischemia. - Staged echo: There was no echocardiographic evidence for   stress-induced ischemia. - Impressions: Normal stress echo PVC at rest on couplet in recover   no significant NSVT no ischemia normal hemodynamic response.  Impressions:  - Normal stress echo PVC at rest on couplet in recover no   significant NSVT no ischemia normal hemodynamic response.  Bruce protocol. Stress echocardiography.  Birthdate:  Patient birthdate: 03/25/1950.  Age:  Patient is 68 yr old.  Sex:  Gender: male.    BMI: 30.3 kg/m^2.  Patient status:  Outpatient.  Study date:  Study date: 07/17/2014. Study time: 08:01 AM.   Assessment/Plan:  1. PAF - history of failed cardioversion and converted spontaneously - has reverted back to AF - already noted to not have AAD or ablation due to lack of symptoms - he continues  to do very well. No changes made today.   2. HTN - BP better at home. Medicines refilled today. He has had recent labs.   3. PVCs - not noted today. No symptoms.   4. Chronic anticoagulation - no problems noted. Recent labs noted.   5. COVID-19 Education: The signs and symptoms of COVID-19 were discussed with the patient and how to seek care for testing (follow up with PCP or arrange E-visit).  The importance of social distancing, staying at home, hand hygiene and wearing a mask when out in public were discussed today.  Current medicines are reviewed with the patient today.  The patient  does not have concerns regarding medicines other than what has been noted above.  The following changes have been made:  See above.  Labs/ tests ordered today include:    Orders Placed This Encounter  Procedures  . EKG 12-Lead     Disposition:   FU with Dr. Johnsie Cancel in one year. I am happy to see back as needed.   Patient is agreeable to this plan and will call if any problems develop in the interim.   SignedTruitt Merle, NP  10/05/2018 4:10 PM  [facility] [address] [place], [place]  [address] Phone: [phone] Fax: [phone]

## 2018-10-04 DIAGNOSIS — N5201 Erectile dysfunction due to arterial insufficiency: Secondary | ICD-10-CM | POA: Diagnosis not present

## 2018-10-04 DIAGNOSIS — R972 Elevated prostate specific antigen [PSA]: Secondary | ICD-10-CM | POA: Diagnosis not present

## 2018-10-05 ENCOUNTER — Other Ambulatory Visit: Payer: 59

## 2018-10-05 ENCOUNTER — Encounter: Payer: Self-pay | Admitting: Nurse Practitioner

## 2018-10-05 ENCOUNTER — Other Ambulatory Visit: Payer: Self-pay

## 2018-10-05 ENCOUNTER — Ambulatory Visit (INDEPENDENT_AMBULATORY_CARE_PROVIDER_SITE_OTHER): Payer: 59 | Admitting: Nurse Practitioner

## 2018-10-05 VITALS — BP 140/80 | HR 59 | Ht 72.0 in | Wt 217.4 lb

## 2018-10-05 DIAGNOSIS — Z9889 Other specified postprocedural states: Secondary | ICD-10-CM | POA: Diagnosis not present

## 2018-10-05 DIAGNOSIS — Z7901 Long term (current) use of anticoagulants: Secondary | ICD-10-CM

## 2018-10-05 DIAGNOSIS — Z79899 Other long term (current) drug therapy: Secondary | ICD-10-CM

## 2018-10-05 DIAGNOSIS — I480 Paroxysmal atrial fibrillation: Secondary | ICD-10-CM

## 2018-10-05 DIAGNOSIS — I10 Essential (primary) hypertension: Secondary | ICD-10-CM | POA: Diagnosis not present

## 2018-10-05 DIAGNOSIS — Z7189 Other specified counseling: Secondary | ICD-10-CM

## 2018-10-05 DIAGNOSIS — I493 Ventricular premature depolarization: Secondary | ICD-10-CM | POA: Diagnosis not present

## 2018-10-05 MED ORDER — AMLODIPINE BESYLATE 10 MG PO TABS: 10.0000 mg | ORAL_TABLET | Freq: Every day | ORAL | 3 refills | Status: DC

## 2018-10-05 MED ORDER — RIVAROXABAN 20 MG PO TABS: 20.0000 mg | ORAL_TABLET | Freq: Every day | ORAL | 3 refills | Status: AC

## 2018-10-05 MED ORDER — LISINOPRIL-HYDROCHLOROTHIAZIDE 20-25 MG PO TABS: 1.0000 | ORAL_TABLET | Freq: Every day | ORAL | 3 refills | Status: AC

## 2018-10-05 NOTE — Patient Instructions (Addendum)
After Visit Summary:  We will be checking the following labs today - NONE   Medication Instructions:    Continue with your current medicines.   I have sent in your refills today   If you need a refill on your cardiac medications before your next appointment, please call your pharmacy.     Testing/Procedures To Be Arranged:  N/A  Follow-Up:   See Dr. Johnsie Cancel in one year.     At [facility], you and your health needs are our priority.  As part of our continuing mission to provide you with exceptional heart care, we have created designated Provider Care Teams.  These Care Teams include your primary Cardiologist (physician) and Advanced Practice Providers (APPs -  Physician Assistants and Nurse Practitioners) who all work together to provide you with the care you need, when you need it.  Special Instructions:  . Stay safe, stay home, wash your hands for at least 20 seconds and wear a mask when out in public.  . It was good to talk with you today.  Kermit Balo luck with the teaching!   Call the [place] office at [phone] if you have any questions, problems or concerns.

## 2018-11-01 MED FILL — AMLODIPINE BESYLATE 10 MG T: 10 | 90 days supply | Qty: 90 | Fill #0

## 2018-11-02 ENCOUNTER — Other Ambulatory Visit: Payer: Self-pay

## 2018-11-02 MED ORDER — AMLODIPINE BESYLATE 10 MG PO TABS: 10.0000 mg | ORAL_TABLET | Freq: Every day | ORAL | 3 refills | Status: AC

## 2018-11-21 ENCOUNTER — Other Ambulatory Visit: Payer: Self-pay

## 2018-11-21 MED FILL — LISINOPRIL-HCTZ 20-25 MG TA: 20-25 | 90 days supply | Qty: 90 | Fill #0

## 2018-12-28 MED FILL — XARELTO 20 MG TABLET: 20 | 90 days supply | Qty: 90 | Fill #0

## 2019-01-10 IMAGING — CR DG HIP (WITH OR WITHOUT PELVIS) 3-4V BILAT
3 series · 3 of 3 positions shown · non-contrast
Comparison: None.

CLINICAL DATA: 66-year-old male with chronic hip and shoulder pain
no injury. Initial encounter.

EXAM:
DG HIP (WITH OR WITHOUT PELVIS) 3-4V BILAT

[t pelvis a.p.]
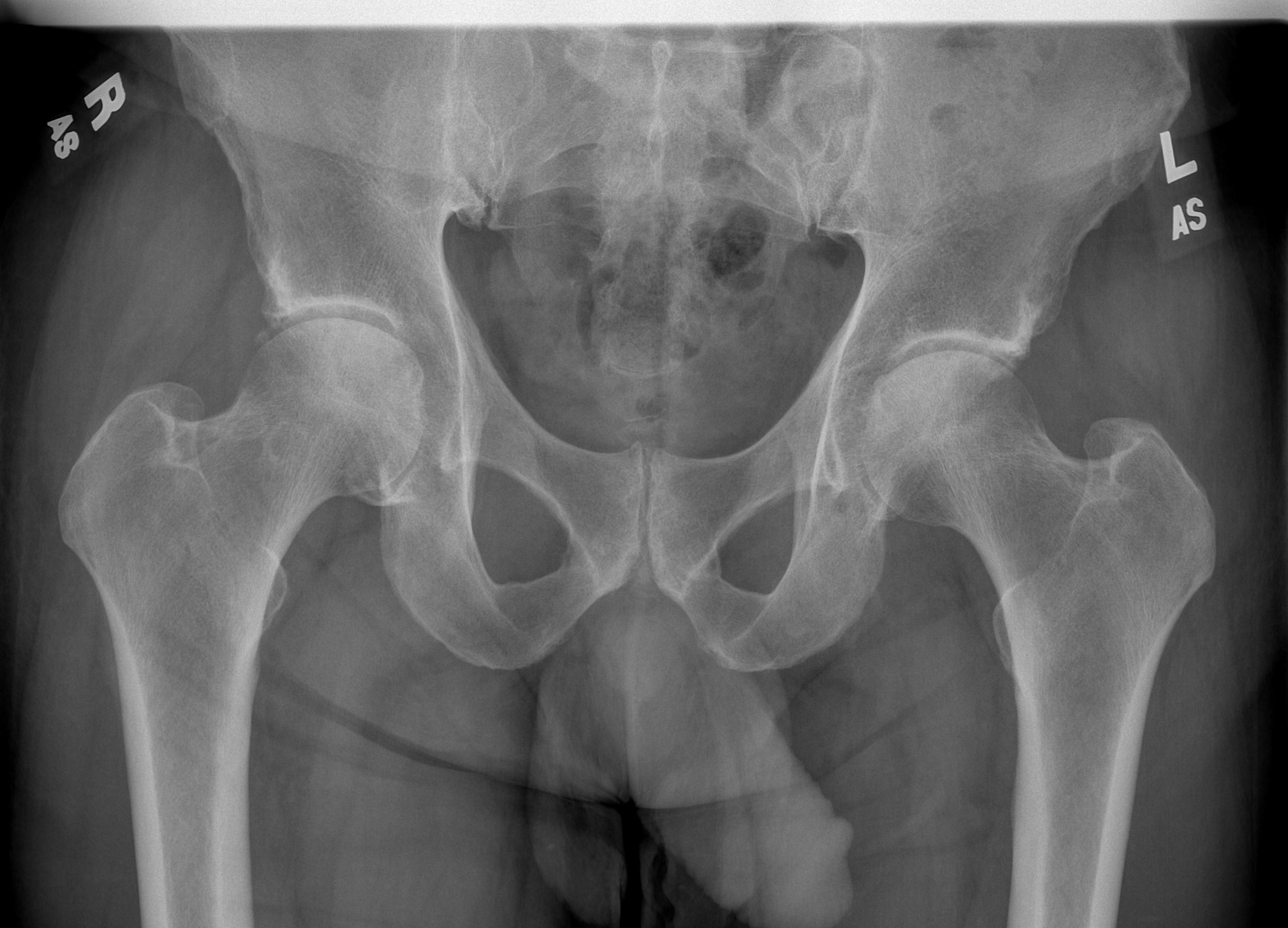

[t hip frog leg right]
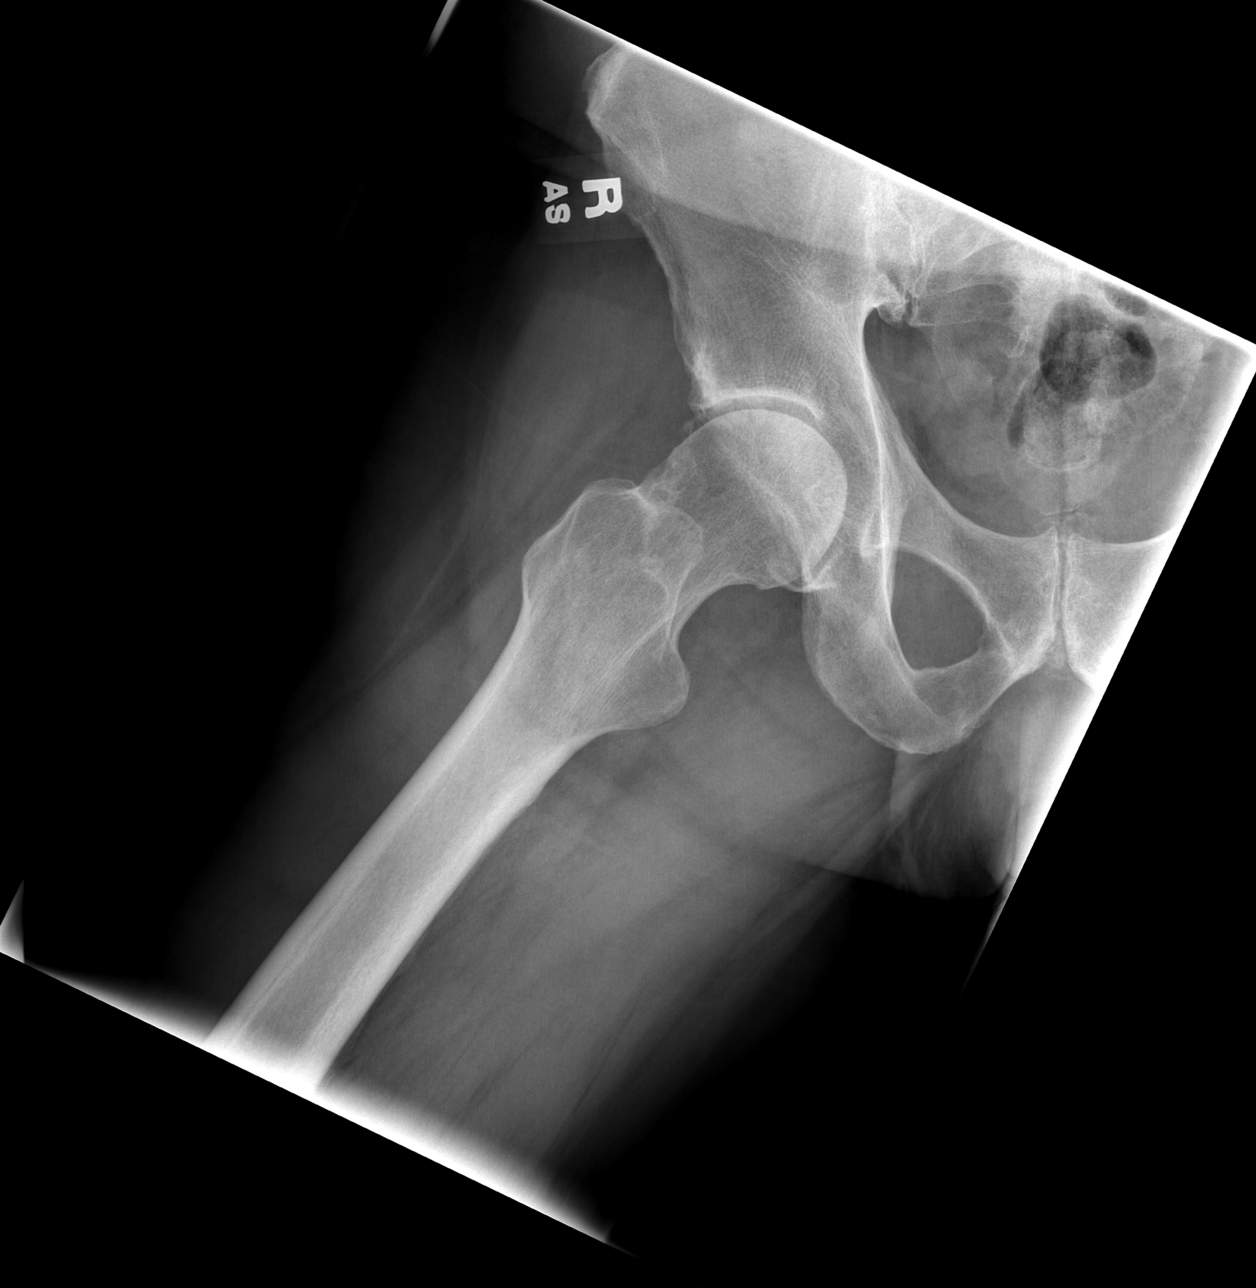

[t hip frog leg left]
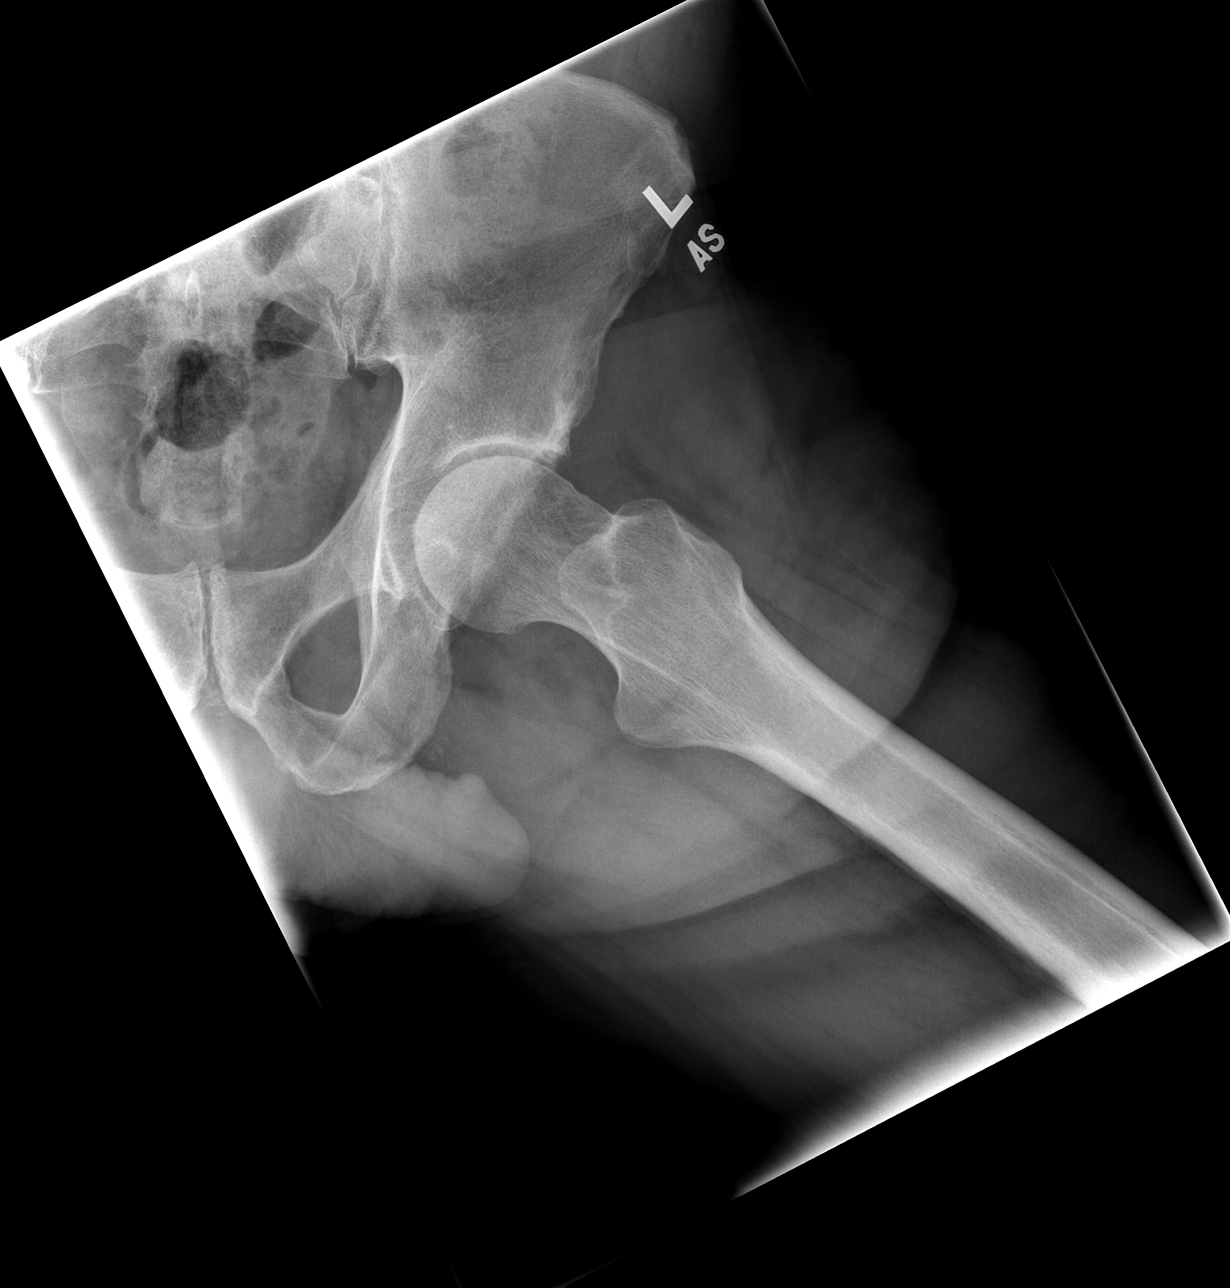

[3 of 3 positions shown; findings below may reference images not displayed]

FINDINGS: Mild bilateral hip joint degenerative change greater on the right
where there are subchondral cystic changes of the femoral neck.

Although there is minimal sclerosis of the femoral heads, no
definitive plain film evidence of avascular necrosis.

Mild pubic symphysis degenerative changes.
IMPRESSION: Mild bilateral hip joint degenerative change greater on right as
detailed above.

## 2019-01-10 IMAGING — CR DG SHOULDER 2+V*R*
3 series · 3 of 3 positions shown · non-contrast
Comparison: None.

CLINICAL DATA: 66-year-old male with chronic hip and shoulder pain.
No injury. Initial encounter.

EXAM:
RIGHT SHOULDER - 2+ VIEW

[w shoulder ap internal righ]
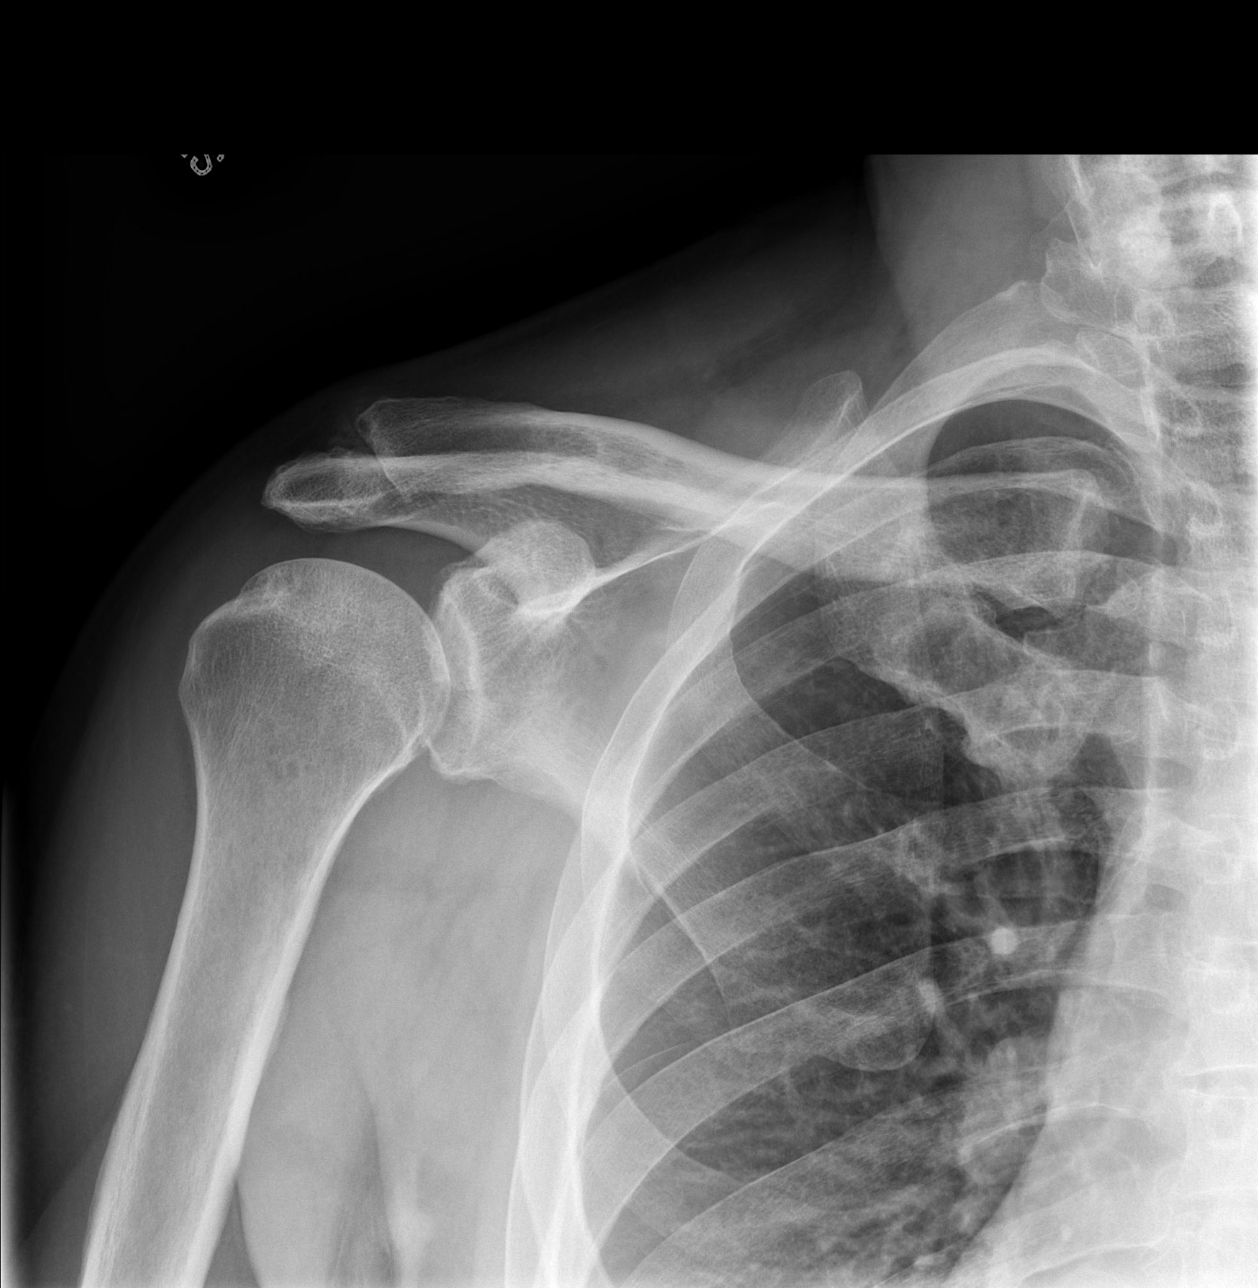

[w shoulder y view right *]
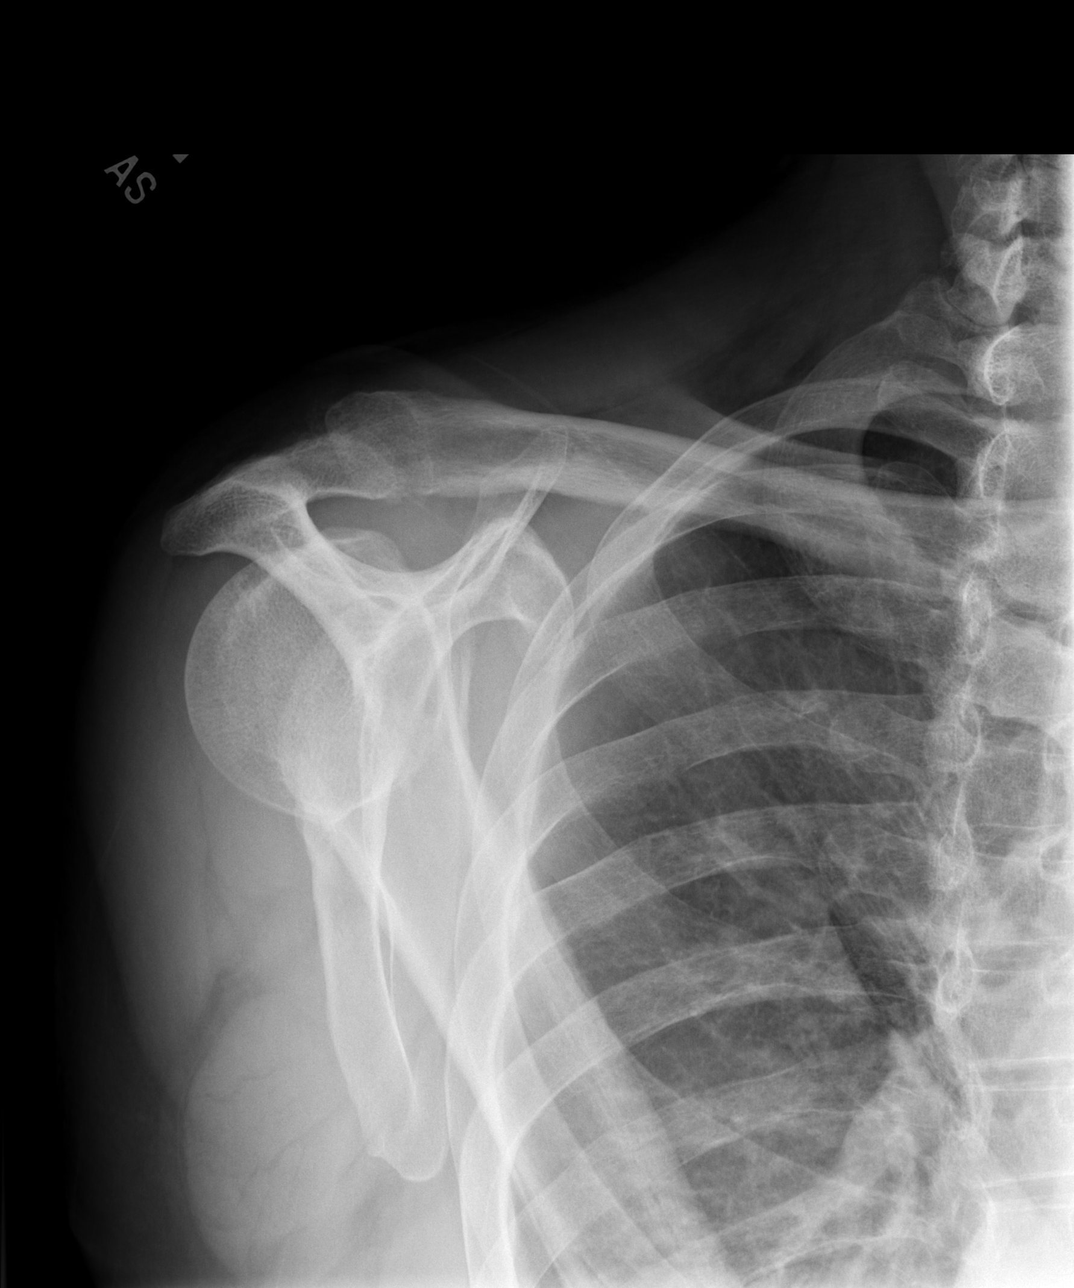

[w shoulder axillary right *]
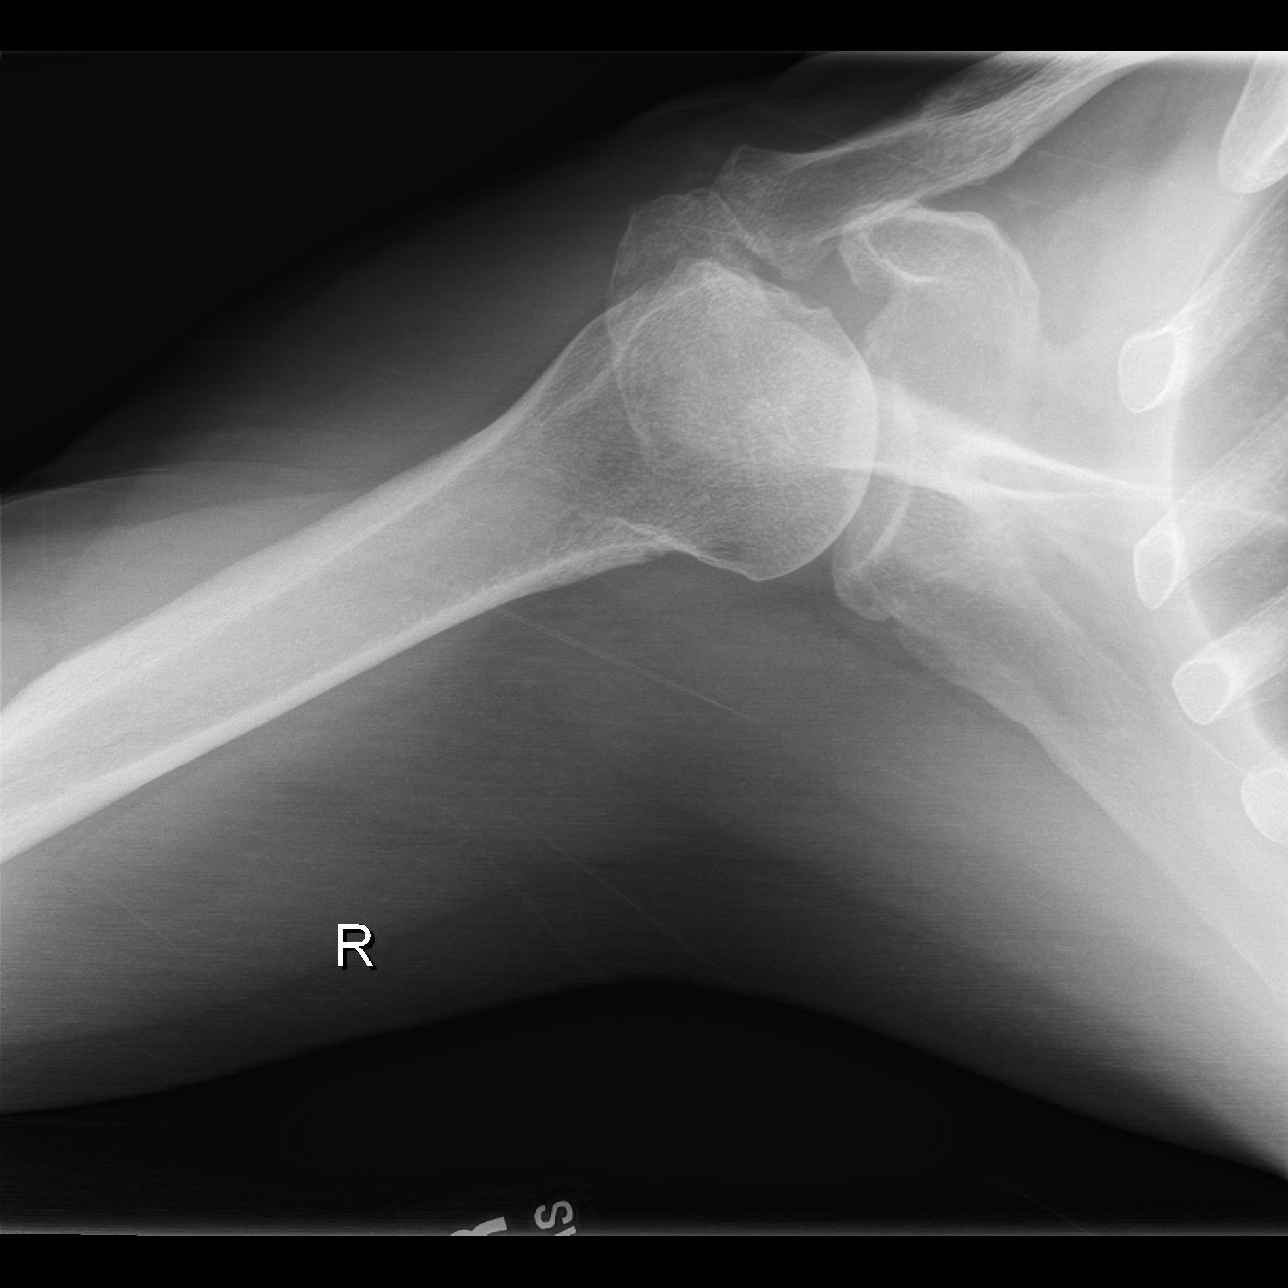

[3 of 3 positions shown; findings below may reference images not displayed]

FINDINGS: Mild to slightly moderate right acromioclavicular joint degenerative
changes.

No abnormal soft tissue calcifications.

No fracture or dislocation.

Visualized lungs clear.
IMPRESSION: Mild to slightly moderate right acromioclavicular joint degenerative
changes.

## 2019-01-30 MED FILL — AMLODIPINE BESYLATE 10 MG T: 10 | 90 days supply | Qty: 90 | Fill #1

## 2019-02-20 MED FILL — LISINOPRIL-HCTZ 20-25 MG TA: 20-25 | 90 days supply | Qty: 90 | Fill #1

## 2019-02-21 DIAGNOSIS — M25472 Effusion, left ankle: Secondary | ICD-10-CM | POA: Diagnosis not present

## 2019-03-03 NOTE — Progress Notes (Signed)
CARDIOLOGY OFFICE NOTE  Date:  03/08/2019    Leonard Campbell Date of Birth: 12-07-1950 Medical Record [id number]  PCP:  Gaynelle Arabian, MD  Cardiologist:  [name]    Chief Complaint  Patient presents with  . Edema    Work in visit - seen for Dr. Johnsie Cancel    History of Present Illness: Leonard Campbell is a 69 y.o. male who presents today for a work in  visit.  Seen for Dr. Johnsie Cancel.   He has a history of PAF, CHADSVASC of 2, HTN and asymptomatic PVCs. He had a failed cardioversion back in 2017.   Last seen by Dr. Johnsie Cancel in August of 2019. I saw him in August of 2019 - he was doing well.  His challenge was with the pandemic in that he was the chosen one to home school his 31 year old grandson.   The patient does not have symptoms concerning for COVID-19 infection (fever, chills, cough, or new shortness of breath).   Comes in today. Here alone. He notes that he had his Norvasc stopped by PCP about 4 weeks ago - for swelling. Now BP is high.  Swelling is resolved. His swelling has improved. Not short of breath - unless having to walk a significant distance with a mask on - otherwise he is fine. Got his first COVID vaccine today. His swelling was just in the left foot. Probably has had too much salt - eating lots of takeout - now thinks this correlates to about the time his swelling started - especially [place] food (apparently his wife loves [place]).   Past Medical History:  Diagnosis Date  . Achilles bursitis or tendinitis   . ACHILLES TENDON TEAR   . ANKLE PAIN, RIGHT   . Complication of anesthesia    None per pt!  Marland Kitchen HEEL PAIN, RIGHT   . Hemorrhoids   . HYPERTENSION   . PAROXYSMAL ATRIAL FIBRILLATION   . SINUS TARSI SYNDROME, RIGHT FOOT     Past Surgical History:  Procedure Laterality Date  . CARDIOVERSION N/A 08/15/2015   Procedure: CARDIOVERSION;  Surgeon: Josue Hector, MD;  Location: [facility] ENDOSCOPY;  Service: Cardiovascular;  Laterality: N/A;  . MASS  EXCISION Right 10/22/2015   Procedure: EXCISION MASS RIGHT THUMB;  Surgeon: Daryll Brod, MD;  Location: [place];  Service: Orthopedics;  Laterality: Right;  . TONSILLECTOMY       Medications: Current Meds  Medication Sig  . amLODipine (NORVASC) 10 MG tablet Take 1 tablet (10 mg total) by mouth daily.  Marland Kitchen lisinopril-hydrochlorothiazide (ZESTORETIC) 20-25 MG tablet Take 1 tablet by mouth daily.  . rivaroxaban (XARELTO) 20 MG TABS tablet Take 1 tablet (20 mg total) by mouth daily.   Current Facility-Administered Medications for the 03/08/19 encounter (Office Visit) with Burtis Junes, NP  Medication  . 0.9 %  sodium chloride infusion     Allergies: No Known Allergies  Social History: The patient  reports that he has never smoked. He has never used smokeless tobacco. He reports current alcohol use. He reports that he does not use drugs.   Family History: The patient's family history includes Breast cancer in his maternal aunt; Congestive Heart Failure in his brother; Healthy in his daughter and daughter; Heart failure in his mother; Hypertension in his brother and mother; Kidney disease in his mother; Lung cancer in his maternal aunt.   Review of Systems: Please see the history of present illness.   All  other systems are reviewed and negative.   Physical Exam: VS:  BP (!) 160/90   Pulse 77   Ht 6' (1.829 m)   Wt 216 lb (98 kg)   SpO2 99%   BMI 29.29 kg/m  .  BMI Body mass index is 29.29 kg/m.  Wt Readings from Last 3 Encounters:  03/08/19 216 lb (98 kg)  10/05/18 217 lb 6.4 oz (98.6 kg)  11/08/17 213 lb (96.6 kg)   BP is 150/90 by me.   General: Pleasant. Well developed, well nourished and in no acute distress.  Weight is down a pound.  HEENT: Normal.  Neck: Supple, no JVD, carotid bruits, or masses noted.  Cardiac: Irregular irregular rhythm. Rate is fine. No edema today.  Respiratory:  Lungs are clear to auscultation bilaterally with normal work of  breathing.  GI: Soft and nontender.  MS: No deformity or atrophy. Gait and ROM intact.  Skin: Warm and dry. Color is normal.  Neuro:  Strength and sensation are intact and no gross focal deficits noted.  Psych: Alert, appropriate and with normal affect.   LABORATORY DATA:  EKG:  EKG is not ordered today.   Lab Results  Component Value Date   WBC 7.0 10/06/2017   HGB 14.4 10/06/2017   HCT 41.8 10/06/2017   PLT 298 10/06/2017   GLUCOSE 86 10/06/2017   ALT 15 10/06/2017   AST 13 10/06/2017   NA 142 10/06/2017   K 4.3 10/06/2017   CL 103 10/06/2017   CREATININE 1.01 10/06/2017   BUN 16 10/06/2017   CO2 28 10/06/2017   TSH 0.63 07/17/2015   INR 1.2 (H) 08/09/2015       BNP (last 3 results) No results for input(s): BNP in the last 8760 hours.  ProBNP (last 3 results) No results for input(s): PROBNP in the last 8760 hours.   Other Studies Reviewed Today:  Stress Echo Study Conclusions 2016  - Stress ECG conclusions: There were no stress arrhythmias or conduction abnormalities. The stress ECG was negative for ischemia. - Staged echo: There was no echocardiographic evidence for stress-induced ischemia. - Impressions: Normal stress echo PVC at rest on couplet in recover no significant NSVT no ischemia normal hemodynamic response.  Impressions:  - Normal stress echo PVC at rest on couplet in recover no significant NSVT no ischemia normal hemodynamic response.  Bruce protocol. Stress echocardiography. Birthdate: Patient birthdate: 09/19/1950. Age: Patient is 69 yr old. Sex: Gender: male. BMI: 30.3 kg/m^2. Patient status: Outpatient. Study date: Study date: 07/17/2014. Study time: 08:01 AM.   Assessment/Plan:  1. Left leg swelling - was on high dose Norvasc - this has stopped and his swelling has improved - he has had significant sodium indiscretion - I suspect the combination is the etiology. He is agreeable to cutting back salt.  Would like to restart 1/2 dose of his Norvasc - could consider adding additional 20 mg of ACE if needed.   2. PAF - now persisted - doing well - rate is controlled. Remains on anticoagulation.   3. PVCs - not endorsed.   4. Chronic anticoagulation - no problems noted.   5. COVID-19 Education: The signs and symptoms of COVID-19 were discussed with the patient and how to seek care for testing (follow up with PCP or arrange E-visit).  The importance of social distancing, staying at home, hand hygiene and wearing a mask when out in public were discussed today. He has had his first vaccine today.   Current medicines are  reviewed with the patient today.  The patient does not have concerns regarding medicines other than what has been noted above.  The following changes have been made:  See above.  Labs/ tests ordered today include:   No orders of the defined types were placed in this encounter.    Disposition:   FU with [place] as planned later this year.   Patient is agreeable to this plan and will call if any problems develop in the interim.   SignedTruitt Merle, NP  03/08/2019 3:30 PM  [facility] [address] [place], [place]  [address] Phone: [phone] Fax: [phone]

## 2019-03-07 ENCOUNTER — Other Ambulatory Visit: Payer: 59

## 2019-03-08 ENCOUNTER — Telehealth: Payer: Self-pay | Admitting: *Deleted

## 2019-03-08 ENCOUNTER — Ambulatory Visit: Payer: 59 | Admitting: Nurse Practitioner

## 2019-03-08 ENCOUNTER — Ambulatory Visit: Payer: 59 | Attending: Internal Medicine

## 2019-03-08 ENCOUNTER — Other Ambulatory Visit: Payer: Self-pay

## 2019-03-08 ENCOUNTER — Encounter: Payer: Self-pay | Admitting: Nurse Practitioner

## 2019-03-08 VITALS — BP 160/90 | HR 77 | Ht 72.0 in | Wt 216.0 lb

## 2019-03-08 DIAGNOSIS — Z23 Encounter for immunization: Secondary | ICD-10-CM | POA: Insufficient documentation

## 2019-03-08 DIAGNOSIS — I4819 Other persistent atrial fibrillation: Secondary | ICD-10-CM | POA: Diagnosis not present

## 2019-03-08 DIAGNOSIS — Z7901 Long term (current) use of anticoagulants: Secondary | ICD-10-CM

## 2019-03-08 DIAGNOSIS — I10 Essential (primary) hypertension: Secondary | ICD-10-CM | POA: Diagnosis not present

## 2019-03-08 DIAGNOSIS — R609 Edema, unspecified: Secondary | ICD-10-CM

## 2019-03-08 DIAGNOSIS — Z79899 Other long term (current) drug therapy: Secondary | ICD-10-CM

## 2019-03-08 DIAGNOSIS — Z7189 Other specified counseling: Secondary | ICD-10-CM

## 2019-03-08 MED ORDER — AMLODIPINE BESYLATE 10 MG PO TABS: 5.0000 mg | ORAL_TABLET | Freq: Every day | ORAL | 3 refills | Status: AC

## 2019-03-08 NOTE — Progress Notes (Signed)
   Covid-19 Vaccination Clinic  Name:  Leonard Campbell    MRN: [id number] DOB: [birth year]  03/08/2019  Mr. Leonard Campbell was observed post Covid-19 immunization for 15 minutes without incidence. He was provided with Vaccine Information Sheet and instruction to access the V-Safe system.   [name] was instructed to call 911 with any severe reactions post vaccine: Marland Kitchen Difficulty breathing  . Swelling of your face and throat  . A fast heartbeat  . A bad rash all over your body  . Dizziness and weakness    Immunizations Administered    Name Date Dose VIS Date Route   Pfizer COVID-19 Vaccine 03/08/2019  9:15 AM 0.3 mL 01/27/2019 Intramuscular   Manufacturer: [employer], [employer]   Lot: [id number]   [place]: [id number]

## 2019-03-08 NOTE — Telephone Encounter (Signed)
LVM to see if pt can come in today at 11:30 per provider.

## 2019-03-08 NOTE — Patient Instructions (Signed)
After Visit Summary:  We will be checking the following labs today - NONE   Medication Instructions:    Continue with your current medicines. BUT  Let's restart the Amlodipine but just at half dose - start 1/2 tablet daily    If you need a refill on your cardiac medications before your next appointment, please call your pharmacy.     Testing/Procedures To Be Arranged:  N/A  Follow-Up:   See [place] back as planned.     At [facility], you and your health needs are our priority.  As part of our continuing mission to provide you with exceptional heart care, we have created designated Provider Care Teams.  These Care Teams include your primary Cardiologist (physician) and Advanced Practice Providers (APPs -  Physician Assistants and Nurse Practitioners) who all work together to provide you with the care you need, when you need it.  Special Instructions:  . Stay safe, stay home, wash your hands for at least 20 seconds and wear a mask when out in public.  . It was good to talk with you today.  . Limit the salt - no more [place] food!   Call the [place] office at [phone] if you have any questions, problems or concerns.

## 2019-03-14 DIAGNOSIS — M25472 Effusion, left ankle: Secondary | ICD-10-CM | POA: Diagnosis not present

## 2019-03-14 DIAGNOSIS — I4891 Unspecified atrial fibrillation: Secondary | ICD-10-CM | POA: Diagnosis not present

## 2019-03-14 DIAGNOSIS — I10 Essential (primary) hypertension: Secondary | ICD-10-CM | POA: Diagnosis not present

## 2019-03-28 DIAGNOSIS — R972 Elevated prostate specific antigen [PSA]: Secondary | ICD-10-CM | POA: Diagnosis not present

## 2019-03-29 ENCOUNTER — Ambulatory Visit: Payer: 59 | Attending: Internal Medicine

## 2019-03-29 DIAGNOSIS — Z23 Encounter for immunization: Secondary | ICD-10-CM | POA: Insufficient documentation

## 2019-03-29 NOTE — Progress Notes (Signed)
   Covid-19 Vaccination Clinic  Name:  Leonard Campbell    MRN: [id number] DOB: [birth year]  03/29/2019  Mr. [name] was observed post Covid-19 immunization for 15 minutes without incidence. He was provided with Vaccine Information Sheet and instruction to access the V-Safe system.   Mr. [name] was instructed to call 911 with any severe reactions post vaccine: Marland Kitchen Difficulty breathing  . Swelling of your face and throat  . A fast heartbeat  . A bad rash all over your body  . Dizziness and weakness    Immunizations Administered    Name Date Dose VIS Date Route   Pfizer COVID-19 Vaccine 03/29/2019 11:48 AM 0.3 mL 01/27/2019 Intramuscular   Manufacturer: [employer]   Lot: [id number]   [place]: [id number]

## 2019-04-03 MED FILL — XARELTO 20 MG TABLET: 20 | 90 days supply | Qty: 90 | Fill #1

## 2019-04-04 DIAGNOSIS — R972 Elevated prostate specific antigen [PSA]: Secondary | ICD-10-CM | POA: Diagnosis not present

## 2019-05-03 ENCOUNTER — Emergency Department (HOSPITAL_COMMUNITY)
Admission: EM | Admit: 2019-05-03 | Discharge: 2019-05-18 | Disposition: E | Payer: 59 | Attending: Emergency Medicine | Admitting: Emergency Medicine

## 2019-05-03 DIAGNOSIS — R000 Tachycardia, unspecified: Secondary | ICD-10-CM | POA: Diagnosis not present

## 2019-05-03 DIAGNOSIS — I469 Cardiac arrest, cause unspecified: Secondary | ICD-10-CM | POA: Diagnosis not present

## 2019-05-03 DIAGNOSIS — I10 Essential (primary) hypertension: Secondary | ICD-10-CM | POA: Insufficient documentation

## 2019-05-03 DIAGNOSIS — Z79899 Other long term (current) drug therapy: Secondary | ICD-10-CM | POA: Diagnosis not present

## 2019-05-03 DIAGNOSIS — R0689 Other abnormalities of breathing: Secondary | ICD-10-CM | POA: Diagnosis not present

## 2019-05-03 DIAGNOSIS — Z7901 Long term (current) use of anticoagulants: Secondary | ICD-10-CM | POA: Insufficient documentation

## 2019-05-03 DIAGNOSIS — R404 Transient alteration of awareness: Secondary | ICD-10-CM | POA: Diagnosis not present

## 2019-05-03 DIAGNOSIS — R0902 Hypoxemia: Secondary | ICD-10-CM | POA: Diagnosis not present

## 2019-05-03 DIAGNOSIS — L821 Other seborrheic keratosis: Secondary | ICD-10-CM | POA: Diagnosis not present

## 2019-05-03 DIAGNOSIS — R001 Bradycardia, unspecified: Secondary | ICD-10-CM | POA: Diagnosis not present

## 2019-05-03 MED ORDER — EPINEPHRINE 1 MG/10ML IJ SOSY
PREFILLED_SYRINGE | INTRAMUSCULAR | Status: AC | PRN
  Administered 2019-05-03: 1 via INTRAVENOUS

## 2019-05-03 MED FILL — Medication: Qty: 1 | Status: AC

## 2019-05-03 NOTE — Code Documentation (Signed)
Dr Francia Greaves assessing cardiac activity with ultrasound at bedside

## 2019-05-03 NOTE — ED Notes (Signed)
Family at bedside. 

## 2019-05-03 NOTE — Code Documentation (Signed)
Pulse check: no pulse, CPR resumed

## 2019-05-03 NOTE — ED Triage Notes (Signed)
Pt arrives with [facility], [place], CPR initiated by witnesses at bowling [place]. Pt in v fib the entire time, pt shocked 10 times and given 10 epis, 450mg  amiodarone and 2g magnesium. Pt intubated with 8.0ETT. CPR in progress upon arrival to ED. IV in RAC, IO in L tibia.

## 2019-05-03 NOTE — Code Documentation (Signed)
Pt shocked at 200J, no pulse, CPR resumed.

## 2019-05-03 NOTE — Progress Notes (Signed)
Responded to CPR in progress.  Patient passed shortly after arrival. Provided emotional support to staff.  [place] available as needed.   Jaclynn Major, [place], [facility], Pager [phone]

## 2019-05-03 NOTE — Code Documentation (Signed)
Pulse check: no pulse 

## 2019-05-03 NOTE — Code Documentation (Signed)
Patient time of death occurred at 72. Called by Dr Francia Greaves

## 2019-05-03 NOTE — ED Provider Notes (Signed)
[facility] EMERGENCY DEPARTMENT Provider Note   CSN: [id number] Arrival date & time: 04/30/2019  1445     History No chief complaint on file.   Leonard Campbell is a 69 y.o. male.  69 year old male with prior medical history as detailed below presents in cardiac arrest.  Patient had witnessed arrest while bowling.  Bystanders initiated CPR.  EMS arrived on scene and found him to be in V. fib.  Resuscitation was attempted with multiple shocks and epinephrine x10.  Patient also received amiodarone and magnesium prior to arrival.  No pulse was regained.  Patient arrived in the ED 45 minutes after initiation of resuscitation attempt.  Patient's rhythm was agonal.    Resuscitation attempts continued without success.  Time of death 1450.  The history is provided by the patient and medical records.  Cardiac Arrest Witnessed by:  Friend Incident location: bowling alley. Time since incident:  45 minutes Time before ALS initiated:  Immediate Condition upon EMS arrival:  Unresponsive Pulse:  Absent Initial cardiac rhythm per EMS:  Agonal Treatments prior to arrival:  ACLS protocol Airway:  Intubation prior to arrival Rhythm on admission to ED:  Agonal      Past Medical History:  Diagnosis Date  . Achilles bursitis or tendinitis   . ACHILLES TENDON TEAR   . ANKLE PAIN, RIGHT   . Complication of anesthesia    None per pt!  Marland Kitchen HEEL PAIN, RIGHT   . Hemorrhoids   . HYPERTENSION   . PAROXYSMAL ATRIAL FIBRILLATION   . SINUS TARSI SYNDROME, RIGHT FOOT     Patient Active Problem List   Diagnosis Date Noted  . Acute pain of both shoulders 10/25/2017  . Bilateral acromioclavicular joint arthritis 10/25/2017  . Primary osteoarthritis of both hips 10/25/2017  . Proteinuria 10/25/2017  . History of atrial fibrillation 10/25/2017  . History of colon polyps 10/25/2017  . ANKLE PAIN, RIGHT 02/18/2010  . SINUS TARSI SYNDROME, RIGHT FOOT 02/18/2010  . Essential  hypertension 05/09/2008  . PAROXYSMAL ATRIAL FIBRILLATION 05/09/2008  . ACHILLES BURSITIS OR TENDINITIS 12/02/2007  . HEEL PAIN, RIGHT 12/02/2007  . ACHILLES TENDON TEAR 12/02/2007    Past Surgical History:  Procedure Laterality Date  . CARDIOVERSION N/A 08/15/2015   Procedure: CARDIOVERSION;  Surgeon: Josue Hector, MD;  Location: [facility] ENDOSCOPY;  Service: Cardiovascular;  Laterality: N/A;  . MASS EXCISION Right 10/22/2015   Procedure: EXCISION MASS RIGHT THUMB;  Surgeon: Daryll Brod, MD;  Location: [facility];  Service: Orthopedics;  Laterality: Right;  . TONSILLECTOMY         Family History  Problem Relation Age of Onset  . Heart failure Mother        CHF  . Hypertension Mother   . Kidney disease Mother   . Hypertension Brother   . Congestive Heart Failure Brother   . Breast cancer Maternal Aunt   . Lung cancer Maternal Aunt   . Healthy Daughter   . Healthy Daughter     Social History   Tobacco Use  . Smoking status: Never Smoker  . Smokeless tobacco: Never Used  Substance Use Topics  . Alcohol use: Yes    Comment: social  . Drug use: No    Home Medications Prior to Admission medications   Medication Sig Start Date End Date Taking? Authorizing Provider  amLODipine (NORVASC) 10 MG tablet Take 1 tablet (10 mg total) by mouth daily. 11/02/18   Josue Hector, MD  amLODipine (Vivian) 10  MG tablet Take 0.5 tablets (5 mg total) by mouth daily. 03/08/19 06/06/19  Burtis Junes, NP  lisinopril-hydrochlorothiazide (ZESTORETIC) 20-25 MG tablet Take 1 tablet by mouth daily. 10/05/18   Burtis Junes, NP  rivaroxaban (XARELTO) 20 MG TABS tablet Take 1 tablet (20 mg total) by mouth daily. 10/05/18   Burtis Junes, NP    Allergies    Patient has no known allergies.  Review of Systems   Review of Systems  All other systems reviewed and are negative.   Physical Exam Updated Vital Signs BP (!) 0/0   Pulse (!) 0   Resp (!) 0   Ht 6\' 1"  (1.854 m)   Wt  98 kg   BMI 28.50 kg/m   Physical Exam Vitals and nursing note reviewed.  Constitutional:      General: He is not in acute distress.    Appearance: He is well-developed.     Comments: CPR in progress  HENT:     Head: Normocephalic and atraumatic.     Mouth/Throat:     Comments: ET tube in place.  Easy bag-valve-mask ventilation in progress.  Good breath sounds bilaterally with bagging. Eyes:     Conjunctiva/sclera: Conjunctivae normal.     Pupils: Pupils are equal, round, and reactive to light.  Cardiovascular:     Comments: Pulseless without CPR.  Agonal rhythm on monitor. Pulmonary:     Effort: No respiratory distress.  Abdominal:     General: There is no distension.     Palpations: Abdomen is soft.     Tenderness: There is no abdominal tenderness.  Musculoskeletal:        General: No deformity.     Cervical back: Normal range of motion and neck supple.  Skin:    General: Skin is dry.     Comments: Cool extremities   Neurological:     Comments: Unresponsive      ED Results / Procedures / Treatments   Labs (all labs ordered are listed, but only abnormal results are displayed) Labs Reviewed - No data to display  EKG None  Radiology No results found.  Procedures Procedures (including critical care time) CRITICAL CARE Performed by: Valarie Merino   Total critical care time: 30 minutes  Critical care time was exclusive of separately billable procedures and treating other patients.  Critical care was necessary to treat or prevent imminent or life-threatening deterioration.  Critical care was time spent personally by me on the following activities: development of treatment plan with patient and/or surrogate as well as nursing, discussions with consultants, evaluation of patient's response to treatment, examination of patient, obtaining history from patient or surrogate, ordering and performing treatments and interventions, ordering and review of laboratory  studies, ordering and review of radiographic studies, pulse oximetry and re-evaluation of patient's condition.   Medications Ordered in ED Medications  EPINEPHrine (ADRENALIN) 1 MG/10ML injection (1 Syringe Intravenous Given 05/09/2019 1445)    ED Course  I have reviewed the triage vital signs and the nursing notes.  Pertinent labs & imaging results that were available during my care of the patient were reviewed by me and considered in my medical decision making (see chart for details).    MDM Rules/Calculators/A&P                      MDM  Screen complete  ZEKE PIRILLO was evaluated in Emergency Department on 04/24/2019 for the symptoms described in the history of present  illness. He was evaluated in the context of the global COVID-19 pandemic, which necessitated consideration that the patient might be at risk for infection with the SARS-CoV-2 virus that causes COVID-19. Institutional protocols and algorithms that pertain to the evaluation of patients at risk for COVID-19 are in a state of rapid change based on information released by regulatory bodies including the CDC and federal and state organizations. These policies and algorithms were followed during the patient's care in the ED.  Patient is presenting for evaluation in cardiac arrest.  Witnessed arrest by bystanders.  Presentation is consistent with likely V. fib arrest.  Resuscitation attempts were not accessible.  Family is aware of outcome of case.  Chaplain, family, and friends present at notification of of wife.  Dr. Alroy Dust, covering for Dr. Gaynelle Arabian is aware of case.  Death certificate is to be sent to Dr. Richardson Landry office.  Patient is not an ME case.   Final Clinical Impression(s) / ED Diagnoses Final diagnoses:  Cardiac arrest [facility])    Rx / DC Orders ED Discharge Orders    None       Valarie Merino, MD 04/22/2019 [phone]

## 2019-05-18 DEATH — deceased
